# Patient Record
Sex: Female | Born: 1950 | Race: Black or African American | Hispanic: No | Smoking: Former smoker
Health system: Southern US, Community
[De-identification: ages and names within clinical notes are randomized; demographics above are authoritative.]

## PROBLEM LIST (undated history)

## (undated) DIAGNOSIS — G629 Polyneuropathy, unspecified: Secondary | ICD-10-CM

## (undated) DIAGNOSIS — J45909 Unspecified asthma, uncomplicated: Secondary | ICD-10-CM

## (undated) DIAGNOSIS — I10 Essential (primary) hypertension: Secondary | ICD-10-CM

## (undated) DIAGNOSIS — J449 Chronic obstructive pulmonary disease, unspecified: Secondary | ICD-10-CM

## (undated) DIAGNOSIS — E119 Type 2 diabetes mellitus without complications: Secondary | ICD-10-CM

## (undated) DIAGNOSIS — K219 Gastro-esophageal reflux disease without esophagitis: Secondary | ICD-10-CM

## (undated) DIAGNOSIS — H269 Unspecified cataract: Secondary | ICD-10-CM

## (undated) HISTORY — PX: CHOLECYSTECTOMY: SHX55

## (undated) HISTORY — PX: LUNG SURGERY: SHX703

## (undated) HISTORY — PX: BACK SURGERY: SHX140

---

## 2009-09-22 ENCOUNTER — Encounter: Admission: RE | Admit: 2009-09-22 | Discharge: 2009-12-21 | Payer: Self-pay | Admitting: Neurology

## 2015-07-07 DIAGNOSIS — S2001XA Contusion of right breast, initial encounter: Secondary | ICD-10-CM | POA: Diagnosis not present

## 2015-07-07 DIAGNOSIS — M5417 Radiculopathy, lumbosacral region: Secondary | ICD-10-CM | POA: Diagnosis not present

## 2015-07-07 DIAGNOSIS — R55 Syncope and collapse: Secondary | ICD-10-CM | POA: Diagnosis not present

## 2015-07-07 DIAGNOSIS — J449 Chronic obstructive pulmonary disease, unspecified: Secondary | ICD-10-CM | POA: Diagnosis not present

## 2015-07-07 DIAGNOSIS — E1140 Type 2 diabetes mellitus with diabetic neuropathy, unspecified: Secondary | ICD-10-CM | POA: Diagnosis not present

## 2015-07-07 DIAGNOSIS — S301XXA Contusion of abdominal wall, initial encounter: Secondary | ICD-10-CM | POA: Diagnosis not present

## 2015-07-07 DIAGNOSIS — E1165 Type 2 diabetes mellitus with hyperglycemia: Secondary | ICD-10-CM | POA: Diagnosis not present

## 2015-07-07 DIAGNOSIS — M8580 Other specified disorders of bone density and structure, unspecified site: Secondary | ICD-10-CM | POA: Diagnosis not present

## 2015-07-08 DIAGNOSIS — I517 Cardiomegaly: Secondary | ICD-10-CM | POA: Diagnosis not present

## 2015-07-08 DIAGNOSIS — R55 Syncope and collapse: Secondary | ICD-10-CM | POA: Diagnosis not present

## 2015-07-08 DIAGNOSIS — S20219A Contusion of unspecified front wall of thorax, initial encounter: Secondary | ICD-10-CM | POA: Diagnosis not present

## 2015-07-08 DIAGNOSIS — S2001XA Contusion of right breast, initial encounter: Secondary | ICD-10-CM | POA: Diagnosis not present

## 2015-07-08 DIAGNOSIS — W2211XA Striking against or struck by driver side automobile airbag, initial encounter: Secondary | ICD-10-CM | POA: Diagnosis not present

## 2015-07-09 DIAGNOSIS — Z79891 Long term (current) use of opiate analgesic: Secondary | ICD-10-CM | POA: Diagnosis not present

## 2015-07-09 DIAGNOSIS — Z888 Allergy status to other drugs, medicaments and biological substances status: Secondary | ICD-10-CM | POA: Diagnosis not present

## 2015-07-09 DIAGNOSIS — M5417 Radiculopathy, lumbosacral region: Secondary | ICD-10-CM | POA: Diagnosis present

## 2015-07-09 DIAGNOSIS — M810 Age-related osteoporosis without current pathological fracture: Secondary | ICD-10-CM | POA: Diagnosis present

## 2015-07-09 DIAGNOSIS — Z2821 Immunization not carried out because of patient refusal: Secondary | ICD-10-CM | POA: Diagnosis not present

## 2015-07-09 DIAGNOSIS — J45909 Unspecified asthma, uncomplicated: Secondary | ICD-10-CM | POA: Diagnosis present

## 2015-07-09 DIAGNOSIS — J449 Chronic obstructive pulmonary disease, unspecified: Secondary | ICD-10-CM | POA: Diagnosis present

## 2015-07-09 DIAGNOSIS — E1140 Type 2 diabetes mellitus with diabetic neuropathy, unspecified: Secondary | ICD-10-CM | POA: Diagnosis present

## 2015-07-09 DIAGNOSIS — S2001XA Contusion of right breast, initial encounter: Secondary | ICD-10-CM | POA: Diagnosis present

## 2015-07-09 DIAGNOSIS — K219 Gastro-esophageal reflux disease without esophagitis: Secondary | ICD-10-CM | POA: Diagnosis present

## 2015-07-09 DIAGNOSIS — Z794 Long term (current) use of insulin: Secondary | ICD-10-CM | POA: Diagnosis not present

## 2015-07-09 DIAGNOSIS — E119 Type 2 diabetes mellitus without complications: Secondary | ICD-10-CM | POA: Diagnosis not present

## 2015-07-09 DIAGNOSIS — Z881 Allergy status to other antibiotic agents status: Secondary | ICD-10-CM | POA: Diagnosis not present

## 2015-07-09 DIAGNOSIS — Z79899 Other long term (current) drug therapy: Secondary | ICD-10-CM | POA: Diagnosis not present

## 2015-07-09 DIAGNOSIS — M8580 Other specified disorders of bone density and structure, unspecified site: Secondary | ICD-10-CM | POA: Diagnosis present

## 2015-07-09 DIAGNOSIS — Z7952 Long term (current) use of systemic steroids: Secondary | ICD-10-CM | POA: Diagnosis not present

## 2015-07-09 DIAGNOSIS — Z7951 Long term (current) use of inhaled steroids: Secondary | ICD-10-CM | POA: Diagnosis not present

## 2015-07-09 DIAGNOSIS — Z7984 Long term (current) use of oral hypoglycemic drugs: Secondary | ICD-10-CM | POA: Diagnosis not present

## 2015-07-09 DIAGNOSIS — I10 Essential (primary) hypertension: Secondary | ICD-10-CM | POA: Diagnosis present

## 2015-07-09 DIAGNOSIS — R55 Syncope and collapse: Secondary | ICD-10-CM | POA: Diagnosis present

## 2015-07-09 DIAGNOSIS — Z90710 Acquired absence of both cervix and uterus: Secondary | ICD-10-CM | POA: Diagnosis not present

## 2015-07-14 DIAGNOSIS — Z09 Encounter for follow-up examination after completed treatment for conditions other than malignant neoplasm: Secondary | ICD-10-CM | POA: Diagnosis not present

## 2015-07-14 DIAGNOSIS — R55 Syncope and collapse: Secondary | ICD-10-CM | POA: Diagnosis not present

## 2015-07-14 DIAGNOSIS — E1140 Type 2 diabetes mellitus with diabetic neuropathy, unspecified: Secondary | ICD-10-CM | POA: Diagnosis not present

## 2015-07-14 DIAGNOSIS — J449 Chronic obstructive pulmonary disease, unspecified: Secondary | ICD-10-CM | POA: Diagnosis not present

## 2015-07-21 DIAGNOSIS — J984 Other disorders of lung: Secondary | ICD-10-CM | POA: Diagnosis not present

## 2015-07-21 DIAGNOSIS — R55 Syncope and collapse: Secondary | ICD-10-CM | POA: Diagnosis not present

## 2015-07-21 DIAGNOSIS — Z794 Long term (current) use of insulin: Secondary | ICD-10-CM | POA: Diagnosis not present

## 2015-07-21 DIAGNOSIS — R9431 Abnormal electrocardiogram [ECG] [EKG]: Secondary | ICD-10-CM | POA: Diagnosis not present

## 2015-07-21 DIAGNOSIS — E119 Type 2 diabetes mellitus without complications: Secondary | ICD-10-CM | POA: Diagnosis not present

## 2015-07-21 DIAGNOSIS — I10 Essential (primary) hypertension: Secondary | ICD-10-CM | POA: Diagnosis not present

## 2015-07-29 DIAGNOSIS — R55 Syncope and collapse: Secondary | ICD-10-CM | POA: Diagnosis not present

## 2015-08-18 DIAGNOSIS — M5126 Other intervertebral disc displacement, lumbar region: Secondary | ICD-10-CM | POA: Diagnosis not present

## 2015-08-31 DIAGNOSIS — E119 Type 2 diabetes mellitus without complications: Secondary | ICD-10-CM | POA: Diagnosis not present

## 2015-08-31 DIAGNOSIS — I10 Essential (primary) hypertension: Secondary | ICD-10-CM | POA: Diagnosis not present

## 2015-08-31 DIAGNOSIS — J449 Chronic obstructive pulmonary disease, unspecified: Secondary | ICD-10-CM | POA: Diagnosis not present

## 2015-09-06 DIAGNOSIS — E1122 Type 2 diabetes mellitus with diabetic chronic kidney disease: Secondary | ICD-10-CM | POA: Diagnosis not present

## 2015-09-06 DIAGNOSIS — Z789 Other specified health status: Secondary | ICD-10-CM | POA: Diagnosis not present

## 2015-09-06 DIAGNOSIS — M545 Low back pain: Secondary | ICD-10-CM | POA: Diagnosis not present

## 2015-09-06 DIAGNOSIS — J449 Chronic obstructive pulmonary disease, unspecified: Secondary | ICD-10-CM | POA: Diagnosis not present

## 2015-09-06 DIAGNOSIS — I10 Essential (primary) hypertension: Secondary | ICD-10-CM | POA: Diagnosis not present

## 2015-10-06 DIAGNOSIS — J449 Chronic obstructive pulmonary disease, unspecified: Secondary | ICD-10-CM | POA: Diagnosis not present

## 2015-10-06 DIAGNOSIS — E119 Type 2 diabetes mellitus without complications: Secondary | ICD-10-CM | POA: Diagnosis not present

## 2015-10-06 DIAGNOSIS — I10 Essential (primary) hypertension: Secondary | ICD-10-CM | POA: Diagnosis not present

## 2015-11-09 DIAGNOSIS — E119 Type 2 diabetes mellitus without complications: Secondary | ICD-10-CM | POA: Diagnosis not present

## 2015-11-09 DIAGNOSIS — I10 Essential (primary) hypertension: Secondary | ICD-10-CM | POA: Diagnosis not present

## 2015-11-09 DIAGNOSIS — J449 Chronic obstructive pulmonary disease, unspecified: Secondary | ICD-10-CM | POA: Diagnosis not present

## 2015-12-26 DIAGNOSIS — E1140 Type 2 diabetes mellitus with diabetic neuropathy, unspecified: Secondary | ICD-10-CM | POA: Diagnosis not present

## 2015-12-26 DIAGNOSIS — Z299 Encounter for prophylactic measures, unspecified: Secondary | ICD-10-CM | POA: Diagnosis not present

## 2015-12-30 DIAGNOSIS — E119 Type 2 diabetes mellitus without complications: Secondary | ICD-10-CM | POA: Diagnosis not present

## 2015-12-30 DIAGNOSIS — J449 Chronic obstructive pulmonary disease, unspecified: Secondary | ICD-10-CM | POA: Diagnosis not present

## 2015-12-30 DIAGNOSIS — I10 Essential (primary) hypertension: Secondary | ICD-10-CM | POA: Diagnosis not present

## 2016-01-09 DIAGNOSIS — Z1211 Encounter for screening for malignant neoplasm of colon: Secondary | ICD-10-CM | POA: Diagnosis not present

## 2016-01-09 DIAGNOSIS — Z7189 Other specified counseling: Secondary | ICD-10-CM | POA: Diagnosis not present

## 2016-01-09 DIAGNOSIS — E1122 Type 2 diabetes mellitus with diabetic chronic kidney disease: Secondary | ICD-10-CM | POA: Diagnosis not present

## 2016-01-09 DIAGNOSIS — Z299 Encounter for prophylactic measures, unspecified: Secondary | ICD-10-CM | POA: Diagnosis not present

## 2016-01-09 DIAGNOSIS — F329 Major depressive disorder, single episode, unspecified: Secondary | ICD-10-CM | POA: Diagnosis not present

## 2016-01-09 DIAGNOSIS — Z0000 Encounter for general adult medical examination without abnormal findings: Secondary | ICD-10-CM | POA: Diagnosis not present

## 2016-01-17 DIAGNOSIS — R5383 Other fatigue: Secondary | ICD-10-CM | POA: Diagnosis not present

## 2016-01-17 DIAGNOSIS — Z79899 Other long term (current) drug therapy: Secondary | ICD-10-CM | POA: Diagnosis not present

## 2016-02-10 DIAGNOSIS — J449 Chronic obstructive pulmonary disease, unspecified: Secondary | ICD-10-CM | POA: Diagnosis not present

## 2016-02-10 DIAGNOSIS — I10 Essential (primary) hypertension: Secondary | ICD-10-CM | POA: Diagnosis not present

## 2016-02-10 DIAGNOSIS — E119 Type 2 diabetes mellitus without complications: Secondary | ICD-10-CM | POA: Diagnosis not present

## 2016-02-22 DIAGNOSIS — M5126 Other intervertebral disc displacement, lumbar region: Secondary | ICD-10-CM | POA: Diagnosis not present

## 2016-04-11 DIAGNOSIS — J449 Chronic obstructive pulmonary disease, unspecified: Secondary | ICD-10-CM | POA: Diagnosis not present

## 2016-04-11 DIAGNOSIS — Z299 Encounter for prophylactic measures, unspecified: Secondary | ICD-10-CM | POA: Diagnosis not present

## 2016-04-11 DIAGNOSIS — E1140 Type 2 diabetes mellitus with diabetic neuropathy, unspecified: Secondary | ICD-10-CM | POA: Diagnosis not present

## 2016-04-11 DIAGNOSIS — I10 Essential (primary) hypertension: Secondary | ICD-10-CM | POA: Diagnosis not present

## 2016-04-18 DIAGNOSIS — E119 Type 2 diabetes mellitus without complications: Secondary | ICD-10-CM | POA: Diagnosis not present

## 2016-04-18 DIAGNOSIS — J449 Chronic obstructive pulmonary disease, unspecified: Secondary | ICD-10-CM | POA: Diagnosis not present

## 2016-04-18 DIAGNOSIS — I10 Essential (primary) hypertension: Secondary | ICD-10-CM | POA: Diagnosis not present

## 2016-05-24 DIAGNOSIS — J449 Chronic obstructive pulmonary disease, unspecified: Secondary | ICD-10-CM | POA: Diagnosis not present

## 2016-05-24 DIAGNOSIS — E119 Type 2 diabetes mellitus without complications: Secondary | ICD-10-CM | POA: Diagnosis not present

## 2016-05-24 DIAGNOSIS — I10 Essential (primary) hypertension: Secondary | ICD-10-CM | POA: Diagnosis not present

## 2016-06-21 DIAGNOSIS — J449 Chronic obstructive pulmonary disease, unspecified: Secondary | ICD-10-CM | POA: Diagnosis not present

## 2016-06-21 DIAGNOSIS — I10 Essential (primary) hypertension: Secondary | ICD-10-CM | POA: Diagnosis not present

## 2016-06-21 DIAGNOSIS — E119 Type 2 diabetes mellitus without complications: Secondary | ICD-10-CM | POA: Diagnosis not present

## 2016-07-04 DIAGNOSIS — E1165 Type 2 diabetes mellitus with hyperglycemia: Secondary | ICD-10-CM | POA: Diagnosis not present

## 2016-07-04 DIAGNOSIS — I2699 Other pulmonary embolism without acute cor pulmonale: Secondary | ICD-10-CM | POA: Diagnosis not present

## 2016-07-04 DIAGNOSIS — R0602 Shortness of breath: Secondary | ICD-10-CM | POA: Diagnosis not present

## 2016-07-04 DIAGNOSIS — K869 Disease of pancreas, unspecified: Secondary | ICD-10-CM | POA: Diagnosis not present

## 2016-07-04 DIAGNOSIS — R079 Chest pain, unspecified: Secondary | ICD-10-CM | POA: Diagnosis not present

## 2016-07-05 DIAGNOSIS — G8929 Other chronic pain: Secondary | ICD-10-CM | POA: Diagnosis present

## 2016-07-05 DIAGNOSIS — Z7951 Long term (current) use of inhaled steroids: Secondary | ICD-10-CM | POA: Diagnosis not present

## 2016-07-05 DIAGNOSIS — I10 Essential (primary) hypertension: Secondary | ICD-10-CM | POA: Diagnosis present

## 2016-07-05 DIAGNOSIS — M549 Dorsalgia, unspecified: Secondary | ICD-10-CM | POA: Diagnosis present

## 2016-07-05 DIAGNOSIS — J449 Chronic obstructive pulmonary disease, unspecified: Secondary | ICD-10-CM | POA: Diagnosis present

## 2016-07-05 DIAGNOSIS — Z79891 Long term (current) use of opiate analgesic: Secondary | ICD-10-CM | POA: Diagnosis not present

## 2016-07-05 DIAGNOSIS — K869 Disease of pancreas, unspecified: Secondary | ICD-10-CM | POA: Diagnosis present

## 2016-07-05 DIAGNOSIS — Z2821 Immunization not carried out because of patient refusal: Secondary | ICD-10-CM | POA: Diagnosis not present

## 2016-07-05 DIAGNOSIS — Z79899 Other long term (current) drug therapy: Secondary | ICD-10-CM | POA: Diagnosis not present

## 2016-07-05 DIAGNOSIS — R0602 Shortness of breath: Secondary | ICD-10-CM | POA: Diagnosis not present

## 2016-07-05 DIAGNOSIS — I2699 Other pulmonary embolism without acute cor pulmonale: Secondary | ICD-10-CM | POA: Diagnosis present

## 2016-07-05 DIAGNOSIS — K219 Gastro-esophageal reflux disease without esophagitis: Secondary | ICD-10-CM | POA: Diagnosis present

## 2016-07-05 DIAGNOSIS — Z794 Long term (current) use of insulin: Secondary | ICD-10-CM | POA: Diagnosis not present

## 2016-07-05 DIAGNOSIS — E1165 Type 2 diabetes mellitus with hyperglycemia: Secondary | ICD-10-CM | POA: Diagnosis present

## 2016-07-05 DIAGNOSIS — M810 Age-related osteoporosis without current pathological fracture: Secondary | ICD-10-CM | POA: Diagnosis present

## 2016-07-13 DIAGNOSIS — K869 Disease of pancreas, unspecified: Secondary | ICD-10-CM | POA: Diagnosis not present

## 2016-07-13 DIAGNOSIS — J441 Chronic obstructive pulmonary disease with (acute) exacerbation: Secondary | ICD-10-CM | POA: Diagnosis not present

## 2016-07-13 DIAGNOSIS — Z09 Encounter for follow-up examination after completed treatment for conditions other than malignant neoplasm: Secondary | ICD-10-CM | POA: Diagnosis not present

## 2016-07-13 DIAGNOSIS — I2699 Other pulmonary embolism without acute cor pulmonale: Secondary | ICD-10-CM | POA: Diagnosis not present

## 2016-07-18 DIAGNOSIS — I10 Essential (primary) hypertension: Secondary | ICD-10-CM | POA: Diagnosis not present

## 2016-07-23 DIAGNOSIS — K760 Fatty (change of) liver, not elsewhere classified: Secondary | ICD-10-CM | POA: Diagnosis not present

## 2016-07-23 DIAGNOSIS — D1809 Hemangioma of other sites: Secondary | ICD-10-CM | POA: Diagnosis not present

## 2016-07-23 DIAGNOSIS — K869 Disease of pancreas, unspecified: Secondary | ICD-10-CM | POA: Diagnosis not present

## 2016-08-01 DIAGNOSIS — H353131 Nonexudative age-related macular degeneration, bilateral, early dry stage: Secondary | ICD-10-CM | POA: Diagnosis not present

## 2016-08-01 DIAGNOSIS — M791 Myalgia: Secondary | ICD-10-CM | POA: Diagnosis not present

## 2016-08-01 DIAGNOSIS — Z713 Dietary counseling and surveillance: Secondary | ICD-10-CM | POA: Diagnosis not present

## 2016-08-01 DIAGNOSIS — E119 Type 2 diabetes mellitus without complications: Secondary | ICD-10-CM | POA: Diagnosis not present

## 2016-08-01 DIAGNOSIS — E1122 Type 2 diabetes mellitus with diabetic chronic kidney disease: Secondary | ICD-10-CM | POA: Diagnosis not present

## 2016-08-01 DIAGNOSIS — Z6833 Body mass index (BMI) 33.0-33.9, adult: Secondary | ICD-10-CM | POA: Diagnosis not present

## 2016-08-01 DIAGNOSIS — J449 Chronic obstructive pulmonary disease, unspecified: Secondary | ICD-10-CM | POA: Diagnosis not present

## 2016-08-01 DIAGNOSIS — Z299 Encounter for prophylactic measures, unspecified: Secondary | ICD-10-CM | POA: Diagnosis not present

## 2016-08-01 DIAGNOSIS — Z7984 Long term (current) use of oral hypoglycemic drugs: Secondary | ICD-10-CM | POA: Diagnosis not present

## 2016-08-01 DIAGNOSIS — E1140 Type 2 diabetes mellitus with diabetic neuropathy, unspecified: Secondary | ICD-10-CM | POA: Diagnosis not present

## 2016-08-01 DIAGNOSIS — H3540 Unspecified peripheral retinal degeneration: Secondary | ICD-10-CM | POA: Diagnosis not present

## 2016-08-01 DIAGNOSIS — I10 Essential (primary) hypertension: Secondary | ICD-10-CM | POA: Diagnosis not present

## 2016-09-05 DIAGNOSIS — M5126 Other intervertebral disc displacement, lumbar region: Secondary | ICD-10-CM | POA: Diagnosis not present

## 2016-09-05 DIAGNOSIS — M810 Age-related osteoporosis without current pathological fracture: Secondary | ICD-10-CM | POA: Diagnosis not present

## 2016-10-18 DIAGNOSIS — J449 Chronic obstructive pulmonary disease, unspecified: Secondary | ICD-10-CM | POA: Diagnosis not present

## 2016-10-18 DIAGNOSIS — E119 Type 2 diabetes mellitus without complications: Secondary | ICD-10-CM | POA: Diagnosis not present

## 2016-10-18 DIAGNOSIS — I10 Essential (primary) hypertension: Secondary | ICD-10-CM | POA: Diagnosis not present

## 2016-11-15 DIAGNOSIS — J449 Chronic obstructive pulmonary disease, unspecified: Secondary | ICD-10-CM | POA: Diagnosis not present

## 2016-11-15 DIAGNOSIS — I10 Essential (primary) hypertension: Secondary | ICD-10-CM | POA: Diagnosis not present

## 2016-11-15 DIAGNOSIS — E119 Type 2 diabetes mellitus without complications: Secondary | ICD-10-CM | POA: Diagnosis not present

## 2016-11-29 DIAGNOSIS — E1165 Type 2 diabetes mellitus with hyperglycemia: Secondary | ICD-10-CM | POA: Diagnosis not present

## 2016-11-29 DIAGNOSIS — Z6831 Body mass index (BMI) 31.0-31.9, adult: Secondary | ICD-10-CM | POA: Diagnosis not present

## 2016-11-29 DIAGNOSIS — I10 Essential (primary) hypertension: Secondary | ICD-10-CM | POA: Diagnosis not present

## 2016-11-29 DIAGNOSIS — M1990 Unspecified osteoarthritis, unspecified site: Secondary | ICD-10-CM | POA: Diagnosis not present

## 2016-11-29 DIAGNOSIS — J449 Chronic obstructive pulmonary disease, unspecified: Secondary | ICD-10-CM | POA: Diagnosis not present

## 2016-11-29 DIAGNOSIS — N183 Chronic kidney disease, stage 3 (moderate): Secondary | ICD-10-CM | POA: Diagnosis not present

## 2016-11-29 DIAGNOSIS — F329 Major depressive disorder, single episode, unspecified: Secondary | ICD-10-CM | POA: Diagnosis not present

## 2016-11-29 DIAGNOSIS — K219 Gastro-esophageal reflux disease without esophagitis: Secondary | ICD-10-CM | POA: Diagnosis not present

## 2016-11-29 DIAGNOSIS — E1142 Type 2 diabetes mellitus with diabetic polyneuropathy: Secondary | ICD-10-CM | POA: Diagnosis not present

## 2016-11-29 DIAGNOSIS — J309 Allergic rhinitis, unspecified: Secondary | ICD-10-CM | POA: Diagnosis not present

## 2016-11-29 DIAGNOSIS — Z299 Encounter for prophylactic measures, unspecified: Secondary | ICD-10-CM | POA: Diagnosis not present

## 2016-11-29 DIAGNOSIS — E1122 Type 2 diabetes mellitus with diabetic chronic kidney disease: Secondary | ICD-10-CM | POA: Diagnosis not present

## 2016-12-12 DIAGNOSIS — M810 Age-related osteoporosis without current pathological fracture: Secondary | ICD-10-CM | POA: Diagnosis not present

## 2016-12-12 DIAGNOSIS — M5126 Other intervertebral disc displacement, lumbar region: Secondary | ICD-10-CM | POA: Diagnosis not present

## 2017-01-09 DIAGNOSIS — Z6833 Body mass index (BMI) 33.0-33.9, adult: Secondary | ICD-10-CM | POA: Diagnosis not present

## 2017-01-09 DIAGNOSIS — F329 Major depressive disorder, single episode, unspecified: Secondary | ICD-10-CM | POA: Diagnosis not present

## 2017-01-09 DIAGNOSIS — Z1389 Encounter for screening for other disorder: Secondary | ICD-10-CM | POA: Diagnosis not present

## 2017-01-09 DIAGNOSIS — R5383 Other fatigue: Secondary | ICD-10-CM | POA: Diagnosis not present

## 2017-01-09 DIAGNOSIS — Z0000 Encounter for general adult medical examination without abnormal findings: Secondary | ICD-10-CM | POA: Diagnosis not present

## 2017-01-09 DIAGNOSIS — N183 Chronic kidney disease, stage 3 (moderate): Secondary | ICD-10-CM | POA: Diagnosis not present

## 2017-01-09 DIAGNOSIS — E1122 Type 2 diabetes mellitus with diabetic chronic kidney disease: Secondary | ICD-10-CM | POA: Diagnosis not present

## 2017-01-09 DIAGNOSIS — E1165 Type 2 diabetes mellitus with hyperglycemia: Secondary | ICD-10-CM | POA: Diagnosis not present

## 2017-01-09 DIAGNOSIS — Z299 Encounter for prophylactic measures, unspecified: Secondary | ICD-10-CM | POA: Diagnosis not present

## 2017-01-09 DIAGNOSIS — E1142 Type 2 diabetes mellitus with diabetic polyneuropathy: Secondary | ICD-10-CM | POA: Diagnosis not present

## 2017-01-09 DIAGNOSIS — Z79899 Other long term (current) drug therapy: Secondary | ICD-10-CM | POA: Diagnosis not present

## 2017-01-09 DIAGNOSIS — I10 Essential (primary) hypertension: Secondary | ICD-10-CM | POA: Diagnosis not present

## 2017-01-09 DIAGNOSIS — Z7189 Other specified counseling: Secondary | ICD-10-CM | POA: Diagnosis not present

## 2017-01-09 DIAGNOSIS — Z1211 Encounter for screening for malignant neoplasm of colon: Secondary | ICD-10-CM | POA: Diagnosis not present

## 2017-01-15 DIAGNOSIS — E119 Type 2 diabetes mellitus without complications: Secondary | ICD-10-CM | POA: Diagnosis not present

## 2017-01-15 DIAGNOSIS — I10 Essential (primary) hypertension: Secondary | ICD-10-CM | POA: Diagnosis not present

## 2017-01-15 DIAGNOSIS — J449 Chronic obstructive pulmonary disease, unspecified: Secondary | ICD-10-CM | POA: Diagnosis not present

## 2017-02-22 DIAGNOSIS — E119 Type 2 diabetes mellitus without complications: Secondary | ICD-10-CM | POA: Diagnosis not present

## 2017-02-22 DIAGNOSIS — J449 Chronic obstructive pulmonary disease, unspecified: Secondary | ICD-10-CM | POA: Diagnosis not present

## 2017-02-22 DIAGNOSIS — I10 Essential (primary) hypertension: Secondary | ICD-10-CM | POA: Diagnosis not present

## 2017-03-07 DIAGNOSIS — E1142 Type 2 diabetes mellitus with diabetic polyneuropathy: Secondary | ICD-10-CM | POA: Diagnosis not present

## 2017-03-07 DIAGNOSIS — Z299 Encounter for prophylactic measures, unspecified: Secondary | ICD-10-CM | POA: Diagnosis not present

## 2017-03-07 DIAGNOSIS — J449 Chronic obstructive pulmonary disease, unspecified: Secondary | ICD-10-CM | POA: Diagnosis not present

## 2017-03-07 DIAGNOSIS — N183 Chronic kidney disease, stage 3 (moderate): Secondary | ICD-10-CM | POA: Diagnosis not present

## 2017-03-07 DIAGNOSIS — E1122 Type 2 diabetes mellitus with diabetic chronic kidney disease: Secondary | ICD-10-CM | POA: Diagnosis not present

## 2017-03-07 DIAGNOSIS — F329 Major depressive disorder, single episode, unspecified: Secondary | ICD-10-CM | POA: Diagnosis not present

## 2017-03-07 DIAGNOSIS — I10 Essential (primary) hypertension: Secondary | ICD-10-CM | POA: Diagnosis not present

## 2017-03-07 DIAGNOSIS — E1165 Type 2 diabetes mellitus with hyperglycemia: Secondary | ICD-10-CM | POA: Diagnosis not present

## 2017-03-07 DIAGNOSIS — E8940 Asymptomatic postprocedural ovarian failure: Secondary | ICD-10-CM | POA: Diagnosis not present

## 2017-03-07 DIAGNOSIS — Z6833 Body mass index (BMI) 33.0-33.9, adult: Secondary | ICD-10-CM | POA: Diagnosis not present

## 2017-03-22 DIAGNOSIS — M48062 Spinal stenosis, lumbar region with neurogenic claudication: Secondary | ICD-10-CM | POA: Diagnosis not present

## 2017-03-22 DIAGNOSIS — M4316 Spondylolisthesis, lumbar region: Secondary | ICD-10-CM | POA: Diagnosis not present

## 2017-03-25 DIAGNOSIS — J441 Chronic obstructive pulmonary disease with (acute) exacerbation: Secondary | ICD-10-CM | POA: Diagnosis not present

## 2017-03-25 DIAGNOSIS — E1165 Type 2 diabetes mellitus with hyperglycemia: Secondary | ICD-10-CM | POA: Diagnosis not present

## 2017-03-25 DIAGNOSIS — I10 Essential (primary) hypertension: Secondary | ICD-10-CM | POA: Diagnosis not present

## 2017-03-25 DIAGNOSIS — Z6821 Body mass index (BMI) 21.0-21.9, adult: Secondary | ICD-10-CM | POA: Diagnosis not present

## 2017-03-25 DIAGNOSIS — Z87891 Personal history of nicotine dependence: Secondary | ICD-10-CM | POA: Diagnosis not present

## 2017-03-25 DIAGNOSIS — E669 Obesity, unspecified: Secondary | ICD-10-CM | POA: Diagnosis not present

## 2017-03-25 DIAGNOSIS — Z299 Encounter for prophylactic measures, unspecified: Secondary | ICD-10-CM | POA: Diagnosis not present

## 2017-03-25 DIAGNOSIS — J449 Chronic obstructive pulmonary disease, unspecified: Secondary | ICD-10-CM | POA: Diagnosis not present

## 2017-03-29 DIAGNOSIS — M4316 Spondylolisthesis, lumbar region: Secondary | ICD-10-CM | POA: Diagnosis not present

## 2017-03-29 DIAGNOSIS — M48062 Spinal stenosis, lumbar region with neurogenic claudication: Secondary | ICD-10-CM | POA: Diagnosis not present

## 2017-03-29 DIAGNOSIS — M48061 Spinal stenosis, lumbar region without neurogenic claudication: Secondary | ICD-10-CM | POA: Diagnosis not present

## 2017-03-29 DIAGNOSIS — M47816 Spondylosis without myelopathy or radiculopathy, lumbar region: Secondary | ICD-10-CM | POA: Diagnosis not present

## 2017-03-29 DIAGNOSIS — M47817 Spondylosis without myelopathy or radiculopathy, lumbosacral region: Secondary | ICD-10-CM | POA: Diagnosis not present

## 2017-03-29 DIAGNOSIS — M5136 Other intervertebral disc degeneration, lumbar region: Secondary | ICD-10-CM | POA: Diagnosis not present

## 2017-03-29 DIAGNOSIS — M4807 Spinal stenosis, lumbosacral region: Secondary | ICD-10-CM | POA: Diagnosis not present

## 2017-04-05 DIAGNOSIS — E119 Type 2 diabetes mellitus without complications: Secondary | ICD-10-CM | POA: Diagnosis not present

## 2017-04-05 DIAGNOSIS — I10 Essential (primary) hypertension: Secondary | ICD-10-CM | POA: Diagnosis not present

## 2017-04-05 DIAGNOSIS — J449 Chronic obstructive pulmonary disease, unspecified: Secondary | ICD-10-CM | POA: Diagnosis not present

## 2017-04-12 DIAGNOSIS — M4716 Other spondylosis with myelopathy, lumbar region: Secondary | ICD-10-CM | POA: Diagnosis not present

## 2017-04-12 DIAGNOSIS — M4316 Spondylolisthesis, lumbar region: Secondary | ICD-10-CM | POA: Diagnosis not present

## 2017-04-12 DIAGNOSIS — M48062 Spinal stenosis, lumbar region with neurogenic claudication: Secondary | ICD-10-CM | POA: Diagnosis not present

## 2017-04-26 DIAGNOSIS — J441 Chronic obstructive pulmonary disease with (acute) exacerbation: Secondary | ICD-10-CM | POA: Diagnosis not present

## 2017-04-26 DIAGNOSIS — Z6834 Body mass index (BMI) 34.0-34.9, adult: Secondary | ICD-10-CM | POA: Diagnosis not present

## 2017-04-26 DIAGNOSIS — M79605 Pain in left leg: Secondary | ICD-10-CM | POA: Diagnosis not present

## 2017-04-26 DIAGNOSIS — M1710 Unilateral primary osteoarthritis, unspecified knee: Secondary | ICD-10-CM | POA: Diagnosis not present

## 2017-04-26 DIAGNOSIS — Z299 Encounter for prophylactic measures, unspecified: Secondary | ICD-10-CM | POA: Diagnosis not present

## 2017-04-26 DIAGNOSIS — M79604 Pain in right leg: Secondary | ICD-10-CM | POA: Diagnosis not present

## 2017-04-26 DIAGNOSIS — B370 Candidal stomatitis: Secondary | ICD-10-CM | POA: Diagnosis not present

## 2017-05-03 DIAGNOSIS — I10 Essential (primary) hypertension: Secondary | ICD-10-CM | POA: Diagnosis not present

## 2017-05-03 DIAGNOSIS — E119 Type 2 diabetes mellitus without complications: Secondary | ICD-10-CM | POA: Diagnosis not present

## 2017-05-03 DIAGNOSIS — J449 Chronic obstructive pulmonary disease, unspecified: Secondary | ICD-10-CM | POA: Diagnosis not present

## 2017-05-28 DIAGNOSIS — Z79899 Other long term (current) drug therapy: Secondary | ICD-10-CM | POA: Diagnosis not present

## 2017-05-28 DIAGNOSIS — Z538 Procedure and treatment not carried out for other reasons: Secondary | ICD-10-CM | POA: Diagnosis not present

## 2017-05-28 DIAGNOSIS — J449 Chronic obstructive pulmonary disease, unspecified: Secondary | ICD-10-CM | POA: Diagnosis not present

## 2017-05-28 DIAGNOSIS — R0989 Other specified symptoms and signs involving the circulatory and respiratory systems: Secondary | ICD-10-CM | POA: Diagnosis not present

## 2017-05-28 DIAGNOSIS — E119 Type 2 diabetes mellitus without complications: Secondary | ICD-10-CM | POA: Diagnosis not present

## 2017-05-28 DIAGNOSIS — M4316 Spondylolisthesis, lumbar region: Secondary | ICD-10-CM | POA: Diagnosis not present

## 2017-05-28 DIAGNOSIS — Z7951 Long term (current) use of inhaled steroids: Secondary | ICD-10-CM | POA: Diagnosis not present

## 2017-05-28 DIAGNOSIS — I10 Essential (primary) hypertension: Secondary | ICD-10-CM | POA: Diagnosis not present

## 2017-05-28 DIAGNOSIS — Z794 Long term (current) use of insulin: Secondary | ICD-10-CM | POA: Diagnosis not present

## 2017-05-30 DIAGNOSIS — M4316 Spondylolisthesis, lumbar region: Secondary | ICD-10-CM | POA: Diagnosis not present

## 2017-05-30 DIAGNOSIS — Z79899 Other long term (current) drug therapy: Secondary | ICD-10-CM | POA: Diagnosis not present

## 2017-05-30 DIAGNOSIS — Z538 Procedure and treatment not carried out for other reasons: Secondary | ICD-10-CM | POA: Diagnosis not present

## 2017-05-30 DIAGNOSIS — E119 Type 2 diabetes mellitus without complications: Secondary | ICD-10-CM | POA: Diagnosis not present

## 2017-05-30 DIAGNOSIS — J449 Chronic obstructive pulmonary disease, unspecified: Secondary | ICD-10-CM | POA: Diagnosis not present

## 2017-05-30 DIAGNOSIS — I10 Essential (primary) hypertension: Secondary | ICD-10-CM | POA: Diagnosis not present

## 2017-06-05 DIAGNOSIS — E119 Type 2 diabetes mellitus without complications: Secondary | ICD-10-CM | POA: Diagnosis not present

## 2017-06-05 DIAGNOSIS — I10 Essential (primary) hypertension: Secondary | ICD-10-CM | POA: Diagnosis not present

## 2017-06-05 DIAGNOSIS — J449 Chronic obstructive pulmonary disease, unspecified: Secondary | ICD-10-CM | POA: Diagnosis not present

## 2017-06-07 DIAGNOSIS — D72828 Other elevated white blood cell count: Secondary | ICD-10-CM | POA: Diagnosis not present

## 2017-06-10 DIAGNOSIS — M545 Low back pain: Secondary | ICD-10-CM | POA: Diagnosis not present

## 2017-06-10 DIAGNOSIS — D72829 Elevated white blood cell count, unspecified: Secondary | ICD-10-CM | POA: Diagnosis not present

## 2017-06-10 DIAGNOSIS — E1165 Type 2 diabetes mellitus with hyperglycemia: Secondary | ICD-10-CM | POA: Diagnosis not present

## 2017-06-10 DIAGNOSIS — E1142 Type 2 diabetes mellitus with diabetic polyneuropathy: Secondary | ICD-10-CM | POA: Diagnosis not present

## 2017-06-10 DIAGNOSIS — Z299 Encounter for prophylactic measures, unspecified: Secondary | ICD-10-CM | POA: Diagnosis not present

## 2017-06-10 DIAGNOSIS — I10 Essential (primary) hypertension: Secondary | ICD-10-CM | POA: Diagnosis not present

## 2017-06-10 DIAGNOSIS — Z6835 Body mass index (BMI) 35.0-35.9, adult: Secondary | ICD-10-CM | POA: Diagnosis not present

## 2017-06-10 DIAGNOSIS — J449 Chronic obstructive pulmonary disease, unspecified: Secondary | ICD-10-CM | POA: Diagnosis not present

## 2017-06-12 DIAGNOSIS — D72829 Elevated white blood cell count, unspecified: Secondary | ICD-10-CM | POA: Diagnosis not present

## 2017-06-20 DIAGNOSIS — D72829 Elevated white blood cell count, unspecified: Secondary | ICD-10-CM | POA: Diagnosis not present

## 2017-06-20 DIAGNOSIS — J320 Chronic maxillary sinusitis: Secondary | ICD-10-CM | POA: Diagnosis not present

## 2017-06-20 DIAGNOSIS — J3489 Other specified disorders of nose and nasal sinuses: Secondary | ICD-10-CM | POA: Diagnosis not present

## 2017-07-01 DIAGNOSIS — M4716 Other spondylosis with myelopathy, lumbar region: Secondary | ICD-10-CM | POA: Diagnosis not present

## 2017-07-01 DIAGNOSIS — D72829 Elevated white blood cell count, unspecified: Secondary | ICD-10-CM | POA: Diagnosis not present

## 2017-07-01 DIAGNOSIS — M48062 Spinal stenosis, lumbar region with neurogenic claudication: Secondary | ICD-10-CM | POA: Diagnosis not present

## 2017-07-01 DIAGNOSIS — M4316 Spondylolisthesis, lumbar region: Secondary | ICD-10-CM | POA: Diagnosis not present

## 2017-07-15 DIAGNOSIS — M1990 Unspecified osteoarthritis, unspecified site: Secondary | ICD-10-CM | POA: Diagnosis not present

## 2017-07-15 DIAGNOSIS — D72828 Other elevated white blood cell count: Secondary | ICD-10-CM | POA: Diagnosis not present

## 2017-07-15 DIAGNOSIS — E1165 Type 2 diabetes mellitus with hyperglycemia: Secondary | ICD-10-CM | POA: Diagnosis not present

## 2017-07-15 DIAGNOSIS — M545 Low back pain: Secondary | ICD-10-CM | POA: Diagnosis not present

## 2017-07-15 DIAGNOSIS — D72829 Elevated white blood cell count, unspecified: Secondary | ICD-10-CM | POA: Diagnosis not present

## 2017-07-15 DIAGNOSIS — J449 Chronic obstructive pulmonary disease, unspecified: Secondary | ICD-10-CM | POA: Diagnosis not present

## 2017-07-15 DIAGNOSIS — Z6835 Body mass index (BMI) 35.0-35.9, adult: Secondary | ICD-10-CM | POA: Diagnosis not present

## 2017-07-15 DIAGNOSIS — M4316 Spondylolisthesis, lumbar region: Secondary | ICD-10-CM | POA: Diagnosis not present

## 2017-07-15 DIAGNOSIS — M2550 Pain in unspecified joint: Secondary | ICD-10-CM | POA: Diagnosis not present

## 2017-07-15 DIAGNOSIS — I10 Essential (primary) hypertension: Secondary | ICD-10-CM | POA: Diagnosis not present

## 2017-07-15 DIAGNOSIS — M48062 Spinal stenosis, lumbar region with neurogenic claudication: Secondary | ICD-10-CM | POA: Diagnosis not present

## 2017-07-15 DIAGNOSIS — R5383 Other fatigue: Secondary | ICD-10-CM | POA: Diagnosis not present

## 2017-07-15 DIAGNOSIS — Z299 Encounter for prophylactic measures, unspecified: Secondary | ICD-10-CM | POA: Diagnosis not present

## 2017-08-13 DIAGNOSIS — M79604 Pain in right leg: Secondary | ICD-10-CM | POA: Diagnosis not present

## 2017-08-13 DIAGNOSIS — Z299 Encounter for prophylactic measures, unspecified: Secondary | ICD-10-CM | POA: Diagnosis not present

## 2017-08-13 DIAGNOSIS — E1165 Type 2 diabetes mellitus with hyperglycemia: Secondary | ICD-10-CM | POA: Diagnosis not present

## 2017-08-13 DIAGNOSIS — Z6835 Body mass index (BMI) 35.0-35.9, adult: Secondary | ICD-10-CM | POA: Diagnosis not present

## 2017-08-13 DIAGNOSIS — J441 Chronic obstructive pulmonary disease with (acute) exacerbation: Secondary | ICD-10-CM | POA: Diagnosis not present

## 2017-08-13 DIAGNOSIS — I10 Essential (primary) hypertension: Secondary | ICD-10-CM | POA: Diagnosis not present

## 2017-08-13 DIAGNOSIS — M79605 Pain in left leg: Secondary | ICD-10-CM | POA: Diagnosis not present

## 2017-08-14 DIAGNOSIS — M4316 Spondylolisthesis, lumbar region: Secondary | ICD-10-CM | POA: Diagnosis not present

## 2017-08-14 DIAGNOSIS — M4716 Other spondylosis with myelopathy, lumbar region: Secondary | ICD-10-CM | POA: Diagnosis not present

## 2017-08-14 DIAGNOSIS — M48062 Spinal stenosis, lumbar region with neurogenic claudication: Secondary | ICD-10-CM | POA: Diagnosis not present

## 2017-09-04 DIAGNOSIS — I10 Essential (primary) hypertension: Secondary | ICD-10-CM | POA: Diagnosis not present

## 2017-09-04 DIAGNOSIS — E119 Type 2 diabetes mellitus without complications: Secondary | ICD-10-CM | POA: Diagnosis not present

## 2017-09-04 DIAGNOSIS — J449 Chronic obstructive pulmonary disease, unspecified: Secondary | ICD-10-CM | POA: Diagnosis not present

## 2017-09-05 DIAGNOSIS — Z6835 Body mass index (BMI) 35.0-35.9, adult: Secondary | ICD-10-CM | POA: Diagnosis not present

## 2017-09-05 DIAGNOSIS — B370 Candidal stomatitis: Secondary | ICD-10-CM | POA: Diagnosis not present

## 2017-09-05 DIAGNOSIS — E1142 Type 2 diabetes mellitus with diabetic polyneuropathy: Secondary | ICD-10-CM | POA: Diagnosis not present

## 2017-09-05 DIAGNOSIS — J449 Chronic obstructive pulmonary disease, unspecified: Secondary | ICD-10-CM | POA: Diagnosis not present

## 2017-09-05 DIAGNOSIS — E1122 Type 2 diabetes mellitus with diabetic chronic kidney disease: Secondary | ICD-10-CM | POA: Diagnosis not present

## 2017-09-05 DIAGNOSIS — N183 Chronic kidney disease, stage 3 (moderate): Secondary | ICD-10-CM | POA: Diagnosis not present

## 2017-09-05 DIAGNOSIS — Z299 Encounter for prophylactic measures, unspecified: Secondary | ICD-10-CM | POA: Diagnosis not present

## 2017-09-05 DIAGNOSIS — Z87891 Personal history of nicotine dependence: Secondary | ICD-10-CM | POA: Diagnosis not present

## 2017-09-05 DIAGNOSIS — E1165 Type 2 diabetes mellitus with hyperglycemia: Secondary | ICD-10-CM | POA: Diagnosis not present

## 2017-09-10 DIAGNOSIS — M4326 Fusion of spine, lumbar region: Secondary | ICD-10-CM | POA: Diagnosis not present

## 2017-09-12 DIAGNOSIS — Z79891 Long term (current) use of opiate analgesic: Secondary | ICD-10-CM | POA: Diagnosis not present

## 2017-09-12 DIAGNOSIS — M4316 Spondylolisthesis, lumbar region: Secondary | ICD-10-CM | POA: Diagnosis present

## 2017-09-12 DIAGNOSIS — Z7951 Long term (current) use of inhaled steroids: Secondary | ICD-10-CM | POA: Diagnosis not present

## 2017-09-12 DIAGNOSIS — K219 Gastro-esophageal reflux disease without esophagitis: Secondary | ICD-10-CM | POA: Diagnosis not present

## 2017-09-12 DIAGNOSIS — Z8249 Family history of ischemic heart disease and other diseases of the circulatory system: Secondary | ICD-10-CM | POA: Diagnosis not present

## 2017-09-12 DIAGNOSIS — M47817 Spondylosis without myelopathy or radiculopathy, lumbosacral region: Secondary | ICD-10-CM | POA: Diagnosis not present

## 2017-09-12 DIAGNOSIS — Z981 Arthrodesis status: Secondary | ICD-10-CM | POA: Diagnosis not present

## 2017-09-12 DIAGNOSIS — L299 Pruritus, unspecified: Secondary | ICD-10-CM | POA: Diagnosis present

## 2017-09-12 DIAGNOSIS — M47816 Spondylosis without myelopathy or radiculopathy, lumbar region: Secondary | ICD-10-CM | POA: Diagnosis not present

## 2017-09-12 DIAGNOSIS — Z79899 Other long term (current) drug therapy: Secondary | ICD-10-CM | POA: Diagnosis not present

## 2017-09-12 DIAGNOSIS — Z4789 Encounter for other orthopedic aftercare: Secondary | ICD-10-CM | POA: Diagnosis not present

## 2017-09-12 DIAGNOSIS — I10 Essential (primary) hypertension: Secondary | ICD-10-CM | POA: Diagnosis present

## 2017-09-12 DIAGNOSIS — E119 Type 2 diabetes mellitus without complications: Secondary | ICD-10-CM | POA: Diagnosis present

## 2017-09-12 DIAGNOSIS — M545 Low back pain: Secondary | ICD-10-CM | POA: Diagnosis not present

## 2017-09-12 DIAGNOSIS — M47896 Other spondylosis, lumbar region: Secondary | ICD-10-CM | POA: Diagnosis present

## 2017-09-12 DIAGNOSIS — M6281 Muscle weakness (generalized): Secondary | ICD-10-CM | POA: Diagnosis not present

## 2017-09-12 DIAGNOSIS — Z87891 Personal history of nicotine dependence: Secondary | ICD-10-CM | POA: Diagnosis not present

## 2017-09-12 DIAGNOSIS — R2689 Other abnormalities of gait and mobility: Secondary | ICD-10-CM | POA: Diagnosis not present

## 2017-09-12 DIAGNOSIS — M170 Bilateral primary osteoarthritis of knee: Secondary | ICD-10-CM | POA: Diagnosis present

## 2017-09-12 DIAGNOSIS — M4317 Spondylolisthesis, lumbosacral region: Secondary | ICD-10-CM | POA: Diagnosis present

## 2017-09-12 DIAGNOSIS — Z9109 Other allergy status, other than to drugs and biological substances: Secondary | ICD-10-CM | POA: Diagnosis not present

## 2017-09-12 DIAGNOSIS — J449 Chronic obstructive pulmonary disease, unspecified: Secondary | ICD-10-CM | POA: Diagnosis present

## 2017-09-12 DIAGNOSIS — Z881 Allergy status to other antibiotic agents status: Secondary | ICD-10-CM | POA: Diagnosis not present

## 2017-09-12 DIAGNOSIS — Z794 Long term (current) use of insulin: Secondary | ICD-10-CM | POA: Diagnosis not present

## 2017-09-18 DIAGNOSIS — M4326 Fusion of spine, lumbar region: Secondary | ICD-10-CM | POA: Diagnosis not present

## 2017-09-18 DIAGNOSIS — M4317 Spondylolisthesis, lumbosacral region: Secondary | ICD-10-CM | POA: Diagnosis not present

## 2017-09-18 DIAGNOSIS — E119 Type 2 diabetes mellitus without complications: Secondary | ICD-10-CM | POA: Diagnosis not present

## 2017-09-18 DIAGNOSIS — Z4789 Encounter for other orthopedic aftercare: Secondary | ICD-10-CM | POA: Diagnosis not present

## 2017-09-18 DIAGNOSIS — I10 Essential (primary) hypertension: Secondary | ICD-10-CM | POA: Diagnosis not present

## 2017-09-18 DIAGNOSIS — E1165 Type 2 diabetes mellitus with hyperglycemia: Secondary | ICD-10-CM | POA: Diagnosis not present

## 2017-09-18 DIAGNOSIS — M47896 Other spondylosis, lumbar region: Secondary | ICD-10-CM | POA: Diagnosis not present

## 2017-09-18 DIAGNOSIS — M4316 Spondylolisthesis, lumbar region: Secondary | ICD-10-CM | POA: Diagnosis not present

## 2017-09-18 DIAGNOSIS — M6281 Muscle weakness (generalized): Secondary | ICD-10-CM | POA: Diagnosis not present

## 2017-09-18 DIAGNOSIS — R2689 Other abnormalities of gait and mobility: Secondary | ICD-10-CM | POA: Diagnosis not present

## 2017-09-18 DIAGNOSIS — Z981 Arthrodesis status: Secondary | ICD-10-CM | POA: Diagnosis not present

## 2017-09-18 DIAGNOSIS — K219 Gastro-esophageal reflux disease without esophagitis: Secondary | ICD-10-CM | POA: Diagnosis not present

## 2017-09-18 DIAGNOSIS — J449 Chronic obstructive pulmonary disease, unspecified: Secondary | ICD-10-CM | POA: Diagnosis not present

## 2017-09-19 DIAGNOSIS — Z981 Arthrodesis status: Secondary | ICD-10-CM | POA: Diagnosis not present

## 2017-09-19 DIAGNOSIS — E119 Type 2 diabetes mellitus without complications: Secondary | ICD-10-CM | POA: Diagnosis not present

## 2017-09-19 DIAGNOSIS — I10 Essential (primary) hypertension: Secondary | ICD-10-CM | POA: Diagnosis not present

## 2017-09-29 DIAGNOSIS — E119 Type 2 diabetes mellitus without complications: Secondary | ICD-10-CM | POA: Diagnosis not present

## 2017-09-29 DIAGNOSIS — M4326 Fusion of spine, lumbar region: Secondary | ICD-10-CM | POA: Diagnosis not present

## 2017-10-02 DIAGNOSIS — E1165 Type 2 diabetes mellitus with hyperglycemia: Secondary | ICD-10-CM | POA: Diagnosis not present

## 2017-10-02 DIAGNOSIS — Z981 Arthrodesis status: Secondary | ICD-10-CM | POA: Diagnosis not present

## 2017-10-04 DIAGNOSIS — Z4789 Encounter for other orthopedic aftercare: Secondary | ICD-10-CM | POA: Diagnosis not present

## 2017-10-04 DIAGNOSIS — I10 Essential (primary) hypertension: Secondary | ICD-10-CM | POA: Diagnosis not present

## 2017-10-04 DIAGNOSIS — M170 Bilateral primary osteoarthritis of knee: Secondary | ICD-10-CM | POA: Diagnosis not present

## 2017-10-04 DIAGNOSIS — J449 Chronic obstructive pulmonary disease, unspecified: Secondary | ICD-10-CM | POA: Diagnosis not present

## 2017-10-04 DIAGNOSIS — Z794 Long term (current) use of insulin: Secondary | ICD-10-CM | POA: Diagnosis not present

## 2017-10-04 DIAGNOSIS — E119 Type 2 diabetes mellitus without complications: Secondary | ICD-10-CM | POA: Diagnosis not present

## 2017-10-04 DIAGNOSIS — M4727 Other spondylosis with radiculopathy, lumbosacral region: Secondary | ICD-10-CM | POA: Diagnosis not present

## 2017-10-05 DIAGNOSIS — I10 Essential (primary) hypertension: Secondary | ICD-10-CM | POA: Diagnosis not present

## 2017-10-05 DIAGNOSIS — M4727 Other spondylosis with radiculopathy, lumbosacral region: Secondary | ICD-10-CM | POA: Diagnosis not present

## 2017-10-05 DIAGNOSIS — E119 Type 2 diabetes mellitus without complications: Secondary | ICD-10-CM | POA: Diagnosis not present

## 2017-10-05 DIAGNOSIS — J449 Chronic obstructive pulmonary disease, unspecified: Secondary | ICD-10-CM | POA: Diagnosis not present

## 2017-10-05 DIAGNOSIS — Z4789 Encounter for other orthopedic aftercare: Secondary | ICD-10-CM | POA: Diagnosis not present

## 2017-10-05 DIAGNOSIS — M170 Bilateral primary osteoarthritis of knee: Secondary | ICD-10-CM | POA: Diagnosis not present

## 2017-10-07 DIAGNOSIS — M4727 Other spondylosis with radiculopathy, lumbosacral region: Secondary | ICD-10-CM | POA: Diagnosis not present

## 2017-10-07 DIAGNOSIS — J449 Chronic obstructive pulmonary disease, unspecified: Secondary | ICD-10-CM | POA: Diagnosis not present

## 2017-10-07 DIAGNOSIS — Z4789 Encounter for other orthopedic aftercare: Secondary | ICD-10-CM | POA: Diagnosis not present

## 2017-10-07 DIAGNOSIS — E119 Type 2 diabetes mellitus without complications: Secondary | ICD-10-CM | POA: Diagnosis not present

## 2017-10-07 DIAGNOSIS — M170 Bilateral primary osteoarthritis of knee: Secondary | ICD-10-CM | POA: Diagnosis not present

## 2017-10-07 DIAGNOSIS — I10 Essential (primary) hypertension: Secondary | ICD-10-CM | POA: Diagnosis not present

## 2017-10-08 DIAGNOSIS — I10 Essential (primary) hypertension: Secondary | ICD-10-CM | POA: Diagnosis not present

## 2017-10-08 DIAGNOSIS — Z4789 Encounter for other orthopedic aftercare: Secondary | ICD-10-CM | POA: Diagnosis not present

## 2017-10-08 DIAGNOSIS — M4727 Other spondylosis with radiculopathy, lumbosacral region: Secondary | ICD-10-CM | POA: Diagnosis not present

## 2017-10-08 DIAGNOSIS — J449 Chronic obstructive pulmonary disease, unspecified: Secondary | ICD-10-CM | POA: Diagnosis not present

## 2017-10-08 DIAGNOSIS — M170 Bilateral primary osteoarthritis of knee: Secondary | ICD-10-CM | POA: Diagnosis not present

## 2017-10-08 DIAGNOSIS — E119 Type 2 diabetes mellitus without complications: Secondary | ICD-10-CM | POA: Diagnosis not present

## 2017-10-09 DIAGNOSIS — Z4789 Encounter for other orthopedic aftercare: Secondary | ICD-10-CM | POA: Diagnosis not present

## 2017-10-09 DIAGNOSIS — M170 Bilateral primary osteoarthritis of knee: Secondary | ICD-10-CM | POA: Diagnosis not present

## 2017-10-09 DIAGNOSIS — I10 Essential (primary) hypertension: Secondary | ICD-10-CM | POA: Diagnosis not present

## 2017-10-09 DIAGNOSIS — J449 Chronic obstructive pulmonary disease, unspecified: Secondary | ICD-10-CM | POA: Diagnosis not present

## 2017-10-09 DIAGNOSIS — E119 Type 2 diabetes mellitus without complications: Secondary | ICD-10-CM | POA: Diagnosis not present

## 2017-10-09 DIAGNOSIS — M4727 Other spondylosis with radiculopathy, lumbosacral region: Secondary | ICD-10-CM | POA: Diagnosis not present

## 2017-10-10 DIAGNOSIS — M170 Bilateral primary osteoarthritis of knee: Secondary | ICD-10-CM | POA: Diagnosis not present

## 2017-10-10 DIAGNOSIS — Z299 Encounter for prophylactic measures, unspecified: Secondary | ICD-10-CM | POA: Diagnosis not present

## 2017-10-10 DIAGNOSIS — J449 Chronic obstructive pulmonary disease, unspecified: Secondary | ICD-10-CM | POA: Diagnosis not present

## 2017-10-10 DIAGNOSIS — E1142 Type 2 diabetes mellitus with diabetic polyneuropathy: Secondary | ICD-10-CM | POA: Diagnosis not present

## 2017-10-10 DIAGNOSIS — E119 Type 2 diabetes mellitus without complications: Secondary | ICD-10-CM | POA: Diagnosis not present

## 2017-10-10 DIAGNOSIS — I10 Essential (primary) hypertension: Secondary | ICD-10-CM | POA: Diagnosis not present

## 2017-10-10 DIAGNOSIS — M47816 Spondylosis without myelopathy or radiculopathy, lumbar region: Secondary | ICD-10-CM | POA: Diagnosis not present

## 2017-10-10 DIAGNOSIS — E1165 Type 2 diabetes mellitus with hyperglycemia: Secondary | ICD-10-CM | POA: Diagnosis not present

## 2017-10-10 DIAGNOSIS — M4316 Spondylolisthesis, lumbar region: Secondary | ICD-10-CM | POA: Diagnosis not present

## 2017-10-10 DIAGNOSIS — Z9889 Other specified postprocedural states: Secondary | ICD-10-CM | POA: Diagnosis not present

## 2017-10-10 DIAGNOSIS — N183 Chronic kidney disease, stage 3 (moderate): Secondary | ICD-10-CM | POA: Diagnosis not present

## 2017-10-10 DIAGNOSIS — Z981 Arthrodesis status: Secondary | ICD-10-CM | POA: Diagnosis not present

## 2017-10-10 DIAGNOSIS — E1122 Type 2 diabetes mellitus with diabetic chronic kidney disease: Secondary | ICD-10-CM | POA: Diagnosis not present

## 2017-10-10 DIAGNOSIS — Z4789 Encounter for other orthopedic aftercare: Secondary | ICD-10-CM | POA: Diagnosis not present

## 2017-10-10 DIAGNOSIS — M4727 Other spondylosis with radiculopathy, lumbosacral region: Secondary | ICD-10-CM | POA: Diagnosis not present

## 2017-10-11 DIAGNOSIS — M170 Bilateral primary osteoarthritis of knee: Secondary | ICD-10-CM | POA: Diagnosis not present

## 2017-10-11 DIAGNOSIS — I10 Essential (primary) hypertension: Secondary | ICD-10-CM | POA: Diagnosis not present

## 2017-10-11 DIAGNOSIS — J449 Chronic obstructive pulmonary disease, unspecified: Secondary | ICD-10-CM | POA: Diagnosis not present

## 2017-10-11 DIAGNOSIS — M4727 Other spondylosis with radiculopathy, lumbosacral region: Secondary | ICD-10-CM | POA: Diagnosis not present

## 2017-10-11 DIAGNOSIS — Z4789 Encounter for other orthopedic aftercare: Secondary | ICD-10-CM | POA: Diagnosis not present

## 2017-10-11 DIAGNOSIS — E119 Type 2 diabetes mellitus without complications: Secondary | ICD-10-CM | POA: Diagnosis not present

## 2017-10-15 DIAGNOSIS — R9431 Abnormal electrocardiogram [ECG] [EKG]: Secondary | ICD-10-CM | POA: Diagnosis not present

## 2017-10-15 DIAGNOSIS — A419 Sepsis, unspecified organism: Secondary | ICD-10-CM | POA: Diagnosis not present

## 2017-10-15 DIAGNOSIS — Z794 Long term (current) use of insulin: Secondary | ICD-10-CM | POA: Diagnosis not present

## 2017-10-15 DIAGNOSIS — E1140 Type 2 diabetes mellitus with diabetic neuropathy, unspecified: Secondary | ICD-10-CM | POA: Diagnosis present

## 2017-10-15 DIAGNOSIS — A09 Infectious gastroenteritis and colitis, unspecified: Secondary | ICD-10-CM | POA: Diagnosis present

## 2017-10-15 DIAGNOSIS — R509 Fever, unspecified: Secondary | ICD-10-CM | POA: Diagnosis not present

## 2017-10-15 DIAGNOSIS — T8140XA Infection following a procedure, unspecified, initial encounter: Secondary | ICD-10-CM | POA: Diagnosis not present

## 2017-10-15 DIAGNOSIS — I10 Essential (primary) hypertension: Secondary | ICD-10-CM | POA: Diagnosis present

## 2017-10-15 DIAGNOSIS — R6521 Severe sepsis with septic shock: Secondary | ICD-10-CM | POA: Diagnosis not present

## 2017-10-15 DIAGNOSIS — S31000A Unspecified open wound of lower back and pelvis without penetration into retroperitoneum, initial encounter: Secondary | ICD-10-CM | POA: Diagnosis not present

## 2017-10-15 DIAGNOSIS — M25551 Pain in right hip: Secondary | ICD-10-CM | POA: Diagnosis not present

## 2017-10-15 DIAGNOSIS — Z87891 Personal history of nicotine dependence: Secondary | ICD-10-CM | POA: Diagnosis not present

## 2017-10-15 DIAGNOSIS — E118 Type 2 diabetes mellitus with unspecified complications: Secondary | ICD-10-CM | POA: Diagnosis not present

## 2017-10-15 DIAGNOSIS — Z9889 Other specified postprocedural states: Secondary | ICD-10-CM | POA: Diagnosis not present

## 2017-10-15 DIAGNOSIS — Z79899 Other long term (current) drug therapy: Secondary | ICD-10-CM | POA: Diagnosis not present

## 2017-10-15 DIAGNOSIS — D649 Anemia, unspecified: Secondary | ICD-10-CM | POA: Diagnosis not present

## 2017-10-15 DIAGNOSIS — K219 Gastro-esophageal reflux disease without esophagitis: Secondary | ICD-10-CM | POA: Diagnosis present

## 2017-10-15 DIAGNOSIS — M25552 Pain in left hip: Secondary | ICD-10-CM | POA: Diagnosis not present

## 2017-10-15 DIAGNOSIS — J449 Chronic obstructive pulmonary disease, unspecified: Secondary | ICD-10-CM | POA: Diagnosis present

## 2017-10-19 DIAGNOSIS — E119 Type 2 diabetes mellitus without complications: Secondary | ICD-10-CM | POA: Diagnosis not present

## 2017-10-19 DIAGNOSIS — I10 Essential (primary) hypertension: Secondary | ICD-10-CM | POA: Diagnosis not present

## 2017-10-19 DIAGNOSIS — M170 Bilateral primary osteoarthritis of knee: Secondary | ICD-10-CM | POA: Diagnosis not present

## 2017-10-19 DIAGNOSIS — Z4789 Encounter for other orthopedic aftercare: Secondary | ICD-10-CM | POA: Diagnosis not present

## 2017-10-19 DIAGNOSIS — J449 Chronic obstructive pulmonary disease, unspecified: Secondary | ICD-10-CM | POA: Diagnosis not present

## 2017-10-19 DIAGNOSIS — M4727 Other spondylosis with radiculopathy, lumbosacral region: Secondary | ICD-10-CM | POA: Diagnosis not present

## 2017-10-22 DIAGNOSIS — D72829 Elevated white blood cell count, unspecified: Secondary | ICD-10-CM | POA: Diagnosis not present

## 2017-10-22 DIAGNOSIS — I10 Essential (primary) hypertension: Secondary | ICD-10-CM | POA: Diagnosis not present

## 2017-10-22 DIAGNOSIS — E119 Type 2 diabetes mellitus without complications: Secondary | ICD-10-CM | POA: Diagnosis not present

## 2017-10-22 DIAGNOSIS — J449 Chronic obstructive pulmonary disease, unspecified: Secondary | ICD-10-CM | POA: Diagnosis not present

## 2017-10-22 DIAGNOSIS — M4727 Other spondylosis with radiculopathy, lumbosacral region: Secondary | ICD-10-CM | POA: Diagnosis not present

## 2017-10-22 DIAGNOSIS — R197 Diarrhea, unspecified: Secondary | ICD-10-CM | POA: Diagnosis not present

## 2017-10-22 DIAGNOSIS — Z299 Encounter for prophylactic measures, unspecified: Secondary | ICD-10-CM | POA: Diagnosis not present

## 2017-10-22 DIAGNOSIS — Z6834 Body mass index (BMI) 34.0-34.9, adult: Secondary | ICD-10-CM | POA: Diagnosis not present

## 2017-10-22 DIAGNOSIS — M170 Bilateral primary osteoarthritis of knee: Secondary | ICD-10-CM | POA: Diagnosis not present

## 2017-10-22 DIAGNOSIS — M545 Low back pain: Secondary | ICD-10-CM | POA: Diagnosis not present

## 2017-10-22 DIAGNOSIS — Z4789 Encounter for other orthopedic aftercare: Secondary | ICD-10-CM | POA: Diagnosis not present

## 2017-10-22 DIAGNOSIS — R5383 Other fatigue: Secondary | ICD-10-CM | POA: Diagnosis not present

## 2017-10-23 DIAGNOSIS — I10 Essential (primary) hypertension: Secondary | ICD-10-CM | POA: Diagnosis not present

## 2017-10-23 DIAGNOSIS — J449 Chronic obstructive pulmonary disease, unspecified: Secondary | ICD-10-CM | POA: Diagnosis not present

## 2017-10-23 DIAGNOSIS — E119 Type 2 diabetes mellitus without complications: Secondary | ICD-10-CM | POA: Diagnosis not present

## 2017-10-23 DIAGNOSIS — M170 Bilateral primary osteoarthritis of knee: Secondary | ICD-10-CM | POA: Diagnosis not present

## 2017-10-23 DIAGNOSIS — Z4789 Encounter for other orthopedic aftercare: Secondary | ICD-10-CM | POA: Diagnosis not present

## 2017-10-23 DIAGNOSIS — M4727 Other spondylosis with radiculopathy, lumbosacral region: Secondary | ICD-10-CM | POA: Diagnosis not present

## 2017-10-24 DIAGNOSIS — M170 Bilateral primary osteoarthritis of knee: Secondary | ICD-10-CM | POA: Diagnosis not present

## 2017-10-24 DIAGNOSIS — J449 Chronic obstructive pulmonary disease, unspecified: Secondary | ICD-10-CM | POA: Diagnosis not present

## 2017-10-24 DIAGNOSIS — E119 Type 2 diabetes mellitus without complications: Secondary | ICD-10-CM | POA: Diagnosis not present

## 2017-10-24 DIAGNOSIS — M4727 Other spondylosis with radiculopathy, lumbosacral region: Secondary | ICD-10-CM | POA: Diagnosis not present

## 2017-10-24 DIAGNOSIS — Z4789 Encounter for other orthopedic aftercare: Secondary | ICD-10-CM | POA: Diagnosis not present

## 2017-10-24 DIAGNOSIS — I10 Essential (primary) hypertension: Secondary | ICD-10-CM | POA: Diagnosis not present

## 2017-10-25 DIAGNOSIS — E119 Type 2 diabetes mellitus without complications: Secondary | ICD-10-CM | POA: Diagnosis not present

## 2017-10-25 DIAGNOSIS — M4727 Other spondylosis with radiculopathy, lumbosacral region: Secondary | ICD-10-CM | POA: Diagnosis not present

## 2017-10-25 DIAGNOSIS — I10 Essential (primary) hypertension: Secondary | ICD-10-CM | POA: Diagnosis not present

## 2017-10-25 DIAGNOSIS — M170 Bilateral primary osteoarthritis of knee: Secondary | ICD-10-CM | POA: Diagnosis not present

## 2017-10-25 DIAGNOSIS — J449 Chronic obstructive pulmonary disease, unspecified: Secondary | ICD-10-CM | POA: Diagnosis not present

## 2017-10-25 DIAGNOSIS — Z4789 Encounter for other orthopedic aftercare: Secondary | ICD-10-CM | POA: Diagnosis not present

## 2017-10-28 DIAGNOSIS — I10 Essential (primary) hypertension: Secondary | ICD-10-CM | POA: Diagnosis not present

## 2017-10-28 DIAGNOSIS — E119 Type 2 diabetes mellitus without complications: Secondary | ICD-10-CM | POA: Diagnosis not present

## 2017-10-28 DIAGNOSIS — Z4789 Encounter for other orthopedic aftercare: Secondary | ICD-10-CM | POA: Diagnosis not present

## 2017-10-28 DIAGNOSIS — M170 Bilateral primary osteoarthritis of knee: Secondary | ICD-10-CM | POA: Diagnosis not present

## 2017-10-28 DIAGNOSIS — M4727 Other spondylosis with radiculopathy, lumbosacral region: Secondary | ICD-10-CM | POA: Diagnosis not present

## 2017-10-28 DIAGNOSIS — J449 Chronic obstructive pulmonary disease, unspecified: Secondary | ICD-10-CM | POA: Diagnosis not present

## 2017-10-29 DIAGNOSIS — Z4789 Encounter for other orthopedic aftercare: Secondary | ICD-10-CM | POA: Diagnosis not present

## 2017-10-29 DIAGNOSIS — I10 Essential (primary) hypertension: Secondary | ICD-10-CM | POA: Diagnosis not present

## 2017-10-29 DIAGNOSIS — E119 Type 2 diabetes mellitus without complications: Secondary | ICD-10-CM | POA: Diagnosis not present

## 2017-10-29 DIAGNOSIS — M170 Bilateral primary osteoarthritis of knee: Secondary | ICD-10-CM | POA: Diagnosis not present

## 2017-10-29 DIAGNOSIS — J449 Chronic obstructive pulmonary disease, unspecified: Secondary | ICD-10-CM | POA: Diagnosis not present

## 2017-10-29 DIAGNOSIS — M4727 Other spondylosis with radiculopathy, lumbosacral region: Secondary | ICD-10-CM | POA: Diagnosis not present

## 2017-10-30 DIAGNOSIS — M170 Bilateral primary osteoarthritis of knee: Secondary | ICD-10-CM | POA: Diagnosis not present

## 2017-10-30 DIAGNOSIS — I10 Essential (primary) hypertension: Secondary | ICD-10-CM | POA: Diagnosis not present

## 2017-10-30 DIAGNOSIS — E119 Type 2 diabetes mellitus without complications: Secondary | ICD-10-CM | POA: Diagnosis not present

## 2017-10-30 DIAGNOSIS — M4727 Other spondylosis with radiculopathy, lumbosacral region: Secondary | ICD-10-CM | POA: Diagnosis not present

## 2017-10-30 DIAGNOSIS — J449 Chronic obstructive pulmonary disease, unspecified: Secondary | ICD-10-CM | POA: Diagnosis not present

## 2017-10-30 DIAGNOSIS — Z4789 Encounter for other orthopedic aftercare: Secondary | ICD-10-CM | POA: Diagnosis not present

## 2017-10-31 DIAGNOSIS — Z4789 Encounter for other orthopedic aftercare: Secondary | ICD-10-CM | POA: Diagnosis not present

## 2017-10-31 DIAGNOSIS — J449 Chronic obstructive pulmonary disease, unspecified: Secondary | ICD-10-CM | POA: Diagnosis not present

## 2017-10-31 DIAGNOSIS — M4727 Other spondylosis with radiculopathy, lumbosacral region: Secondary | ICD-10-CM | POA: Diagnosis not present

## 2017-10-31 DIAGNOSIS — E119 Type 2 diabetes mellitus without complications: Secondary | ICD-10-CM | POA: Diagnosis not present

## 2017-10-31 DIAGNOSIS — I10 Essential (primary) hypertension: Secondary | ICD-10-CM | POA: Diagnosis not present

## 2017-10-31 DIAGNOSIS — M170 Bilateral primary osteoarthritis of knee: Secondary | ICD-10-CM | POA: Diagnosis not present

## 2017-11-04 DIAGNOSIS — M170 Bilateral primary osteoarthritis of knee: Secondary | ICD-10-CM | POA: Diagnosis not present

## 2017-11-04 DIAGNOSIS — J449 Chronic obstructive pulmonary disease, unspecified: Secondary | ICD-10-CM | POA: Diagnosis not present

## 2017-11-04 DIAGNOSIS — I10 Essential (primary) hypertension: Secondary | ICD-10-CM | POA: Diagnosis not present

## 2017-11-04 DIAGNOSIS — M4727 Other spondylosis with radiculopathy, lumbosacral region: Secondary | ICD-10-CM | POA: Diagnosis not present

## 2017-11-04 DIAGNOSIS — E119 Type 2 diabetes mellitus without complications: Secondary | ICD-10-CM | POA: Diagnosis not present

## 2017-11-04 DIAGNOSIS — Z4789 Encounter for other orthopedic aftercare: Secondary | ICD-10-CM | POA: Diagnosis not present

## 2017-11-05 DIAGNOSIS — J449 Chronic obstructive pulmonary disease, unspecified: Secondary | ICD-10-CM | POA: Diagnosis not present

## 2017-11-05 DIAGNOSIS — I10 Essential (primary) hypertension: Secondary | ICD-10-CM | POA: Diagnosis not present

## 2017-11-05 DIAGNOSIS — E119 Type 2 diabetes mellitus without complications: Secondary | ICD-10-CM | POA: Diagnosis not present

## 2017-11-05 DIAGNOSIS — M4727 Other spondylosis with radiculopathy, lumbosacral region: Secondary | ICD-10-CM | POA: Diagnosis not present

## 2017-11-05 DIAGNOSIS — Z4789 Encounter for other orthopedic aftercare: Secondary | ICD-10-CM | POA: Diagnosis not present

## 2017-11-05 DIAGNOSIS — M170 Bilateral primary osteoarthritis of knee: Secondary | ICD-10-CM | POA: Diagnosis not present

## 2017-11-07 DIAGNOSIS — E1165 Type 2 diabetes mellitus with hyperglycemia: Secondary | ICD-10-CM | POA: Diagnosis not present

## 2017-11-07 DIAGNOSIS — Z6834 Body mass index (BMI) 34.0-34.9, adult: Secondary | ICD-10-CM | POA: Diagnosis not present

## 2017-11-07 DIAGNOSIS — N183 Chronic kidney disease, stage 3 (moderate): Secondary | ICD-10-CM | POA: Diagnosis not present

## 2017-11-07 DIAGNOSIS — M170 Bilateral primary osteoarthritis of knee: Secondary | ICD-10-CM | POA: Diagnosis not present

## 2017-11-07 DIAGNOSIS — D500 Iron deficiency anemia secondary to blood loss (chronic): Secondary | ICD-10-CM | POA: Diagnosis not present

## 2017-11-07 DIAGNOSIS — E1142 Type 2 diabetes mellitus with diabetic polyneuropathy: Secondary | ICD-10-CM | POA: Diagnosis not present

## 2017-11-07 DIAGNOSIS — Z299 Encounter for prophylactic measures, unspecified: Secondary | ICD-10-CM | POA: Diagnosis not present

## 2017-11-07 DIAGNOSIS — E119 Type 2 diabetes mellitus without complications: Secondary | ICD-10-CM | POA: Diagnosis not present

## 2017-11-07 DIAGNOSIS — I10 Essential (primary) hypertension: Secondary | ICD-10-CM | POA: Diagnosis not present

## 2017-11-07 DIAGNOSIS — J449 Chronic obstructive pulmonary disease, unspecified: Secondary | ICD-10-CM | POA: Diagnosis not present

## 2017-11-07 DIAGNOSIS — E1122 Type 2 diabetes mellitus with diabetic chronic kidney disease: Secondary | ICD-10-CM | POA: Diagnosis not present

## 2017-11-07 DIAGNOSIS — M4727 Other spondylosis with radiculopathy, lumbosacral region: Secondary | ICD-10-CM | POA: Diagnosis not present

## 2017-11-07 DIAGNOSIS — D72829 Elevated white blood cell count, unspecified: Secondary | ICD-10-CM | POA: Diagnosis not present

## 2017-11-07 DIAGNOSIS — Z4789 Encounter for other orthopedic aftercare: Secondary | ICD-10-CM | POA: Diagnosis not present

## 2017-11-08 DIAGNOSIS — I10 Essential (primary) hypertension: Secondary | ICD-10-CM | POA: Diagnosis not present

## 2017-11-08 DIAGNOSIS — E119 Type 2 diabetes mellitus without complications: Secondary | ICD-10-CM | POA: Diagnosis not present

## 2017-11-08 DIAGNOSIS — Z4789 Encounter for other orthopedic aftercare: Secondary | ICD-10-CM | POA: Diagnosis not present

## 2017-11-08 DIAGNOSIS — M4727 Other spondylosis with radiculopathy, lumbosacral region: Secondary | ICD-10-CM | POA: Diagnosis not present

## 2017-11-08 DIAGNOSIS — J449 Chronic obstructive pulmonary disease, unspecified: Secondary | ICD-10-CM | POA: Diagnosis not present

## 2017-11-08 DIAGNOSIS — M170 Bilateral primary osteoarthritis of knee: Secondary | ICD-10-CM | POA: Diagnosis not present

## 2017-11-13 DIAGNOSIS — I10 Essential (primary) hypertension: Secondary | ICD-10-CM | POA: Diagnosis not present

## 2017-11-13 DIAGNOSIS — Z4789 Encounter for other orthopedic aftercare: Secondary | ICD-10-CM | POA: Diagnosis not present

## 2017-11-13 DIAGNOSIS — E119 Type 2 diabetes mellitus without complications: Secondary | ICD-10-CM | POA: Diagnosis not present

## 2017-11-13 DIAGNOSIS — M170 Bilateral primary osteoarthritis of knee: Secondary | ICD-10-CM | POA: Diagnosis not present

## 2017-11-13 DIAGNOSIS — J449 Chronic obstructive pulmonary disease, unspecified: Secondary | ICD-10-CM | POA: Diagnosis not present

## 2017-11-13 DIAGNOSIS — M4727 Other spondylosis with radiculopathy, lumbosacral region: Secondary | ICD-10-CM | POA: Diagnosis not present

## 2017-11-19 DIAGNOSIS — E119 Type 2 diabetes mellitus without complications: Secondary | ICD-10-CM | POA: Diagnosis not present

## 2017-11-19 DIAGNOSIS — I10 Essential (primary) hypertension: Secondary | ICD-10-CM | POA: Diagnosis not present

## 2017-11-19 DIAGNOSIS — M4727 Other spondylosis with radiculopathy, lumbosacral region: Secondary | ICD-10-CM | POA: Diagnosis not present

## 2017-11-19 DIAGNOSIS — M170 Bilateral primary osteoarthritis of knee: Secondary | ICD-10-CM | POA: Diagnosis not present

## 2017-11-19 DIAGNOSIS — Z4789 Encounter for other orthopedic aftercare: Secondary | ICD-10-CM | POA: Diagnosis not present

## 2017-11-19 DIAGNOSIS — J449 Chronic obstructive pulmonary disease, unspecified: Secondary | ICD-10-CM | POA: Diagnosis not present

## 2017-11-21 DIAGNOSIS — E669 Obesity, unspecified: Secondary | ICD-10-CM | POA: Diagnosis not present

## 2017-11-21 DIAGNOSIS — M2550 Pain in unspecified joint: Secondary | ICD-10-CM | POA: Diagnosis not present

## 2017-11-21 DIAGNOSIS — Z9889 Other specified postprocedural states: Secondary | ICD-10-CM | POA: Diagnosis not present

## 2017-11-21 DIAGNOSIS — Z7952 Long term (current) use of systemic steroids: Secondary | ICD-10-CM | POA: Diagnosis not present

## 2017-11-21 DIAGNOSIS — Z86711 Personal history of pulmonary embolism: Secondary | ICD-10-CM | POA: Diagnosis not present

## 2017-11-21 DIAGNOSIS — Z794 Long term (current) use of insulin: Secondary | ICD-10-CM | POA: Diagnosis not present

## 2017-11-21 DIAGNOSIS — J449 Chronic obstructive pulmonary disease, unspecified: Secondary | ICD-10-CM | POA: Diagnosis not present

## 2017-11-21 DIAGNOSIS — Z79899 Other long term (current) drug therapy: Secondary | ICD-10-CM | POA: Diagnosis not present

## 2017-11-21 DIAGNOSIS — E119 Type 2 diabetes mellitus without complications: Secondary | ICD-10-CM | POA: Diagnosis not present

## 2017-11-21 DIAGNOSIS — K759 Inflammatory liver disease, unspecified: Secondary | ICD-10-CM | POA: Diagnosis not present

## 2017-11-21 DIAGNOSIS — Z882 Allergy status to sulfonamides status: Secondary | ICD-10-CM | POA: Diagnosis not present

## 2017-11-21 DIAGNOSIS — I10 Essential (primary) hypertension: Secondary | ICD-10-CM | POA: Diagnosis not present

## 2017-11-21 DIAGNOSIS — Z87891 Personal history of nicotine dependence: Secondary | ICD-10-CM | POA: Diagnosis not present

## 2017-11-21 DIAGNOSIS — R748 Abnormal levels of other serum enzymes: Secondary | ICD-10-CM | POA: Diagnosis not present

## 2017-11-21 DIAGNOSIS — D509 Iron deficiency anemia, unspecified: Secondary | ICD-10-CM | POA: Diagnosis not present

## 2017-11-29 DIAGNOSIS — Z4789 Encounter for other orthopedic aftercare: Secondary | ICD-10-CM | POA: Diagnosis not present

## 2017-11-29 DIAGNOSIS — E119 Type 2 diabetes mellitus without complications: Secondary | ICD-10-CM | POA: Diagnosis not present

## 2017-11-29 DIAGNOSIS — J449 Chronic obstructive pulmonary disease, unspecified: Secondary | ICD-10-CM | POA: Diagnosis not present

## 2017-11-29 DIAGNOSIS — Z79891 Long term (current) use of opiate analgesic: Secondary | ICD-10-CM | POA: Diagnosis not present

## 2017-11-29 DIAGNOSIS — I10 Essential (primary) hypertension: Secondary | ICD-10-CM | POA: Diagnosis not present

## 2017-11-29 DIAGNOSIS — M4716 Other spondylosis with myelopathy, lumbar region: Secondary | ICD-10-CM | POA: Diagnosis not present

## 2017-11-29 DIAGNOSIS — M170 Bilateral primary osteoarthritis of knee: Secondary | ICD-10-CM | POA: Diagnosis not present

## 2017-11-29 DIAGNOSIS — M4727 Other spondylosis with radiculopathy, lumbosacral region: Secondary | ICD-10-CM | POA: Diagnosis not present

## 2017-12-03 DIAGNOSIS — R748 Abnormal levels of other serum enzymes: Secondary | ICD-10-CM | POA: Diagnosis not present

## 2017-12-03 DIAGNOSIS — M4716 Other spondylosis with myelopathy, lumbar region: Secondary | ICD-10-CM | POA: Diagnosis not present

## 2017-12-03 DIAGNOSIS — M79661 Pain in right lower leg: Secondary | ICD-10-CM | POA: Diagnosis not present

## 2017-12-03 DIAGNOSIS — Z9049 Acquired absence of other specified parts of digestive tract: Secondary | ICD-10-CM | POA: Diagnosis not present

## 2017-12-03 DIAGNOSIS — M79604 Pain in right leg: Secondary | ICD-10-CM | POA: Diagnosis not present

## 2017-12-03 DIAGNOSIS — M79605 Pain in left leg: Secondary | ICD-10-CM | POA: Diagnosis not present

## 2017-12-03 DIAGNOSIS — M79662 Pain in left lower leg: Secondary | ICD-10-CM | POA: Diagnosis not present

## 2017-12-03 DIAGNOSIS — R1011 Right upper quadrant pain: Secondary | ICD-10-CM | POA: Diagnosis not present

## 2017-12-05 DIAGNOSIS — Z981 Arthrodesis status: Secondary | ICD-10-CM | POA: Diagnosis not present

## 2017-12-05 DIAGNOSIS — M4326 Fusion of spine, lumbar region: Secondary | ICD-10-CM | POA: Diagnosis not present

## 2017-12-05 DIAGNOSIS — M5187 Other intervertebral disc disorders, lumbosacral region: Secondary | ICD-10-CM | POA: Diagnosis not present

## 2017-12-05 DIAGNOSIS — M4716 Other spondylosis with myelopathy, lumbar region: Secondary | ICD-10-CM | POA: Diagnosis not present

## 2017-12-13 DIAGNOSIS — Z09 Encounter for follow-up examination after completed treatment for conditions other than malignant neoplasm: Secondary | ICD-10-CM | POA: Diagnosis not present

## 2017-12-13 DIAGNOSIS — M4807 Spinal stenosis, lumbosacral region: Secondary | ICD-10-CM | POA: Diagnosis not present

## 2017-12-13 DIAGNOSIS — M4716 Other spondylosis with myelopathy, lumbar region: Secondary | ICD-10-CM | POA: Diagnosis not present

## 2017-12-19 DIAGNOSIS — J449 Chronic obstructive pulmonary disease, unspecified: Secondary | ICD-10-CM | POA: Diagnosis not present

## 2017-12-19 DIAGNOSIS — I10 Essential (primary) hypertension: Secondary | ICD-10-CM | POA: Diagnosis not present

## 2017-12-19 DIAGNOSIS — E119 Type 2 diabetes mellitus without complications: Secondary | ICD-10-CM | POA: Diagnosis not present

## 2017-12-24 DIAGNOSIS — D72825 Bandemia: Secondary | ICD-10-CM | POA: Diagnosis not present

## 2018-01-10 DIAGNOSIS — M4317 Spondylolisthesis, lumbosacral region: Secondary | ICD-10-CM | POA: Diagnosis not present

## 2018-01-10 DIAGNOSIS — M4716 Other spondylosis with myelopathy, lumbar region: Secondary | ICD-10-CM | POA: Diagnosis not present

## 2018-01-17 DIAGNOSIS — I10 Essential (primary) hypertension: Secondary | ICD-10-CM | POA: Diagnosis not present

## 2018-01-17 DIAGNOSIS — J449 Chronic obstructive pulmonary disease, unspecified: Secondary | ICD-10-CM | POA: Diagnosis not present

## 2018-01-17 DIAGNOSIS — E119 Type 2 diabetes mellitus without complications: Secondary | ICD-10-CM | POA: Diagnosis not present

## 2018-02-13 DIAGNOSIS — E1142 Type 2 diabetes mellitus with diabetic polyneuropathy: Secondary | ICD-10-CM | POA: Diagnosis not present

## 2018-02-13 DIAGNOSIS — Z299 Encounter for prophylactic measures, unspecified: Secondary | ICD-10-CM | POA: Diagnosis not present

## 2018-02-13 DIAGNOSIS — E1122 Type 2 diabetes mellitus with diabetic chronic kidney disease: Secondary | ICD-10-CM | POA: Diagnosis not present

## 2018-02-13 DIAGNOSIS — I10 Essential (primary) hypertension: Secondary | ICD-10-CM | POA: Diagnosis not present

## 2018-02-13 DIAGNOSIS — E1165 Type 2 diabetes mellitus with hyperglycemia: Secondary | ICD-10-CM | POA: Diagnosis not present

## 2018-02-13 DIAGNOSIS — Z6833 Body mass index (BMI) 33.0-33.9, adult: Secondary | ICD-10-CM | POA: Diagnosis not present

## 2018-02-13 DIAGNOSIS — N183 Chronic kidney disease, stage 3 (moderate): Secondary | ICD-10-CM | POA: Diagnosis not present

## 2018-03-05 DIAGNOSIS — E1165 Type 2 diabetes mellitus with hyperglycemia: Secondary | ICD-10-CM | POA: Diagnosis not present

## 2018-03-05 DIAGNOSIS — J449 Chronic obstructive pulmonary disease, unspecified: Secondary | ICD-10-CM | POA: Diagnosis not present

## 2018-03-05 DIAGNOSIS — Z87891 Personal history of nicotine dependence: Secondary | ICD-10-CM | POA: Diagnosis not present

## 2018-03-05 DIAGNOSIS — I10 Essential (primary) hypertension: Secondary | ICD-10-CM | POA: Diagnosis not present

## 2018-03-05 DIAGNOSIS — Z6833 Body mass index (BMI) 33.0-33.9, adult: Secondary | ICD-10-CM | POA: Diagnosis not present

## 2018-03-05 DIAGNOSIS — Z299 Encounter for prophylactic measures, unspecified: Secondary | ICD-10-CM | POA: Diagnosis not present

## 2018-03-14 DIAGNOSIS — I10 Essential (primary) hypertension: Secondary | ICD-10-CM | POA: Diagnosis not present

## 2018-03-14 DIAGNOSIS — J449 Chronic obstructive pulmonary disease, unspecified: Secondary | ICD-10-CM | POA: Diagnosis not present

## 2018-03-14 DIAGNOSIS — E119 Type 2 diabetes mellitus without complications: Secondary | ICD-10-CM | POA: Diagnosis not present

## 2018-03-20 DIAGNOSIS — Z6834 Body mass index (BMI) 34.0-34.9, adult: Secondary | ICD-10-CM | POA: Diagnosis not present

## 2018-03-20 DIAGNOSIS — I10 Essential (primary) hypertension: Secondary | ICD-10-CM | POA: Diagnosis not present

## 2018-03-20 DIAGNOSIS — J449 Chronic obstructive pulmonary disease, unspecified: Secondary | ICD-10-CM | POA: Diagnosis not present

## 2018-03-20 DIAGNOSIS — E1122 Type 2 diabetes mellitus with diabetic chronic kidney disease: Secondary | ICD-10-CM | POA: Diagnosis not present

## 2018-03-20 DIAGNOSIS — E1142 Type 2 diabetes mellitus with diabetic polyneuropathy: Secondary | ICD-10-CM | POA: Diagnosis not present

## 2018-03-20 DIAGNOSIS — Z299 Encounter for prophylactic measures, unspecified: Secondary | ICD-10-CM | POA: Diagnosis not present

## 2018-03-20 DIAGNOSIS — E1165 Type 2 diabetes mellitus with hyperglycemia: Secondary | ICD-10-CM | POA: Diagnosis not present

## 2018-03-20 DIAGNOSIS — Z2821 Immunization not carried out because of patient refusal: Secondary | ICD-10-CM | POA: Diagnosis not present

## 2018-03-31 DIAGNOSIS — H25811 Combined forms of age-related cataract, right eye: Secondary | ICD-10-CM | POA: Diagnosis not present

## 2018-03-31 DIAGNOSIS — E119 Type 2 diabetes mellitus without complications: Secondary | ICD-10-CM | POA: Diagnosis not present

## 2018-03-31 DIAGNOSIS — Z01818 Encounter for other preprocedural examination: Secondary | ICD-10-CM | POA: Diagnosis not present

## 2018-04-16 DIAGNOSIS — J449 Chronic obstructive pulmonary disease, unspecified: Secondary | ICD-10-CM | POA: Diagnosis not present

## 2018-04-16 DIAGNOSIS — E119 Type 2 diabetes mellitus without complications: Secondary | ICD-10-CM | POA: Diagnosis not present

## 2018-04-16 DIAGNOSIS — I10 Essential (primary) hypertension: Secondary | ICD-10-CM | POA: Diagnosis not present

## 2018-04-16 DIAGNOSIS — M4716 Other spondylosis with myelopathy, lumbar region: Secondary | ICD-10-CM | POA: Diagnosis not present

## 2018-04-16 DIAGNOSIS — M4317 Spondylolisthesis, lumbosacral region: Secondary | ICD-10-CM | POA: Diagnosis not present

## 2018-05-05 DIAGNOSIS — H25811 Combined forms of age-related cataract, right eye: Secondary | ICD-10-CM | POA: Diagnosis not present

## 2018-05-05 DIAGNOSIS — H2511 Age-related nuclear cataract, right eye: Secondary | ICD-10-CM | POA: Diagnosis not present

## 2018-05-05 DIAGNOSIS — E119 Type 2 diabetes mellitus without complications: Secondary | ICD-10-CM | POA: Diagnosis not present

## 2018-05-26 DIAGNOSIS — I10 Essential (primary) hypertension: Secondary | ICD-10-CM | POA: Diagnosis not present

## 2018-05-26 DIAGNOSIS — M7550 Bursitis of unspecified shoulder: Secondary | ICD-10-CM | POA: Diagnosis not present

## 2018-05-26 DIAGNOSIS — Z6834 Body mass index (BMI) 34.0-34.9, adult: Secondary | ICD-10-CM | POA: Diagnosis not present

## 2018-05-26 DIAGNOSIS — Z299 Encounter for prophylactic measures, unspecified: Secondary | ICD-10-CM | POA: Diagnosis not present

## 2018-05-26 DIAGNOSIS — N183 Chronic kidney disease, stage 3 (moderate): Secondary | ICD-10-CM | POA: Diagnosis not present

## 2018-05-26 DIAGNOSIS — E1122 Type 2 diabetes mellitus with diabetic chronic kidney disease: Secondary | ICD-10-CM | POA: Diagnosis not present

## 2018-05-26 DIAGNOSIS — E1165 Type 2 diabetes mellitus with hyperglycemia: Secondary | ICD-10-CM | POA: Diagnosis not present

## 2018-05-26 DIAGNOSIS — J449 Chronic obstructive pulmonary disease, unspecified: Secondary | ICD-10-CM | POA: Diagnosis not present

## 2018-05-26 DIAGNOSIS — M7552 Bursitis of left shoulder: Secondary | ICD-10-CM | POA: Diagnosis not present

## 2018-05-26 DIAGNOSIS — E1142 Type 2 diabetes mellitus with diabetic polyneuropathy: Secondary | ICD-10-CM | POA: Diagnosis not present

## 2018-06-04 DIAGNOSIS — Z1211 Encounter for screening for malignant neoplasm of colon: Secondary | ICD-10-CM | POA: Diagnosis not present

## 2018-06-04 DIAGNOSIS — Z7189 Other specified counseling: Secondary | ICD-10-CM | POA: Diagnosis not present

## 2018-06-04 DIAGNOSIS — I10 Essential (primary) hypertension: Secondary | ICD-10-CM | POA: Diagnosis not present

## 2018-06-04 DIAGNOSIS — J449 Chronic obstructive pulmonary disease, unspecified: Secondary | ICD-10-CM | POA: Diagnosis not present

## 2018-06-04 DIAGNOSIS — Z299 Encounter for prophylactic measures, unspecified: Secondary | ICD-10-CM | POA: Diagnosis not present

## 2018-06-04 DIAGNOSIS — Z6834 Body mass index (BMI) 34.0-34.9, adult: Secondary | ICD-10-CM | POA: Diagnosis not present

## 2018-06-04 DIAGNOSIS — Z1331 Encounter for screening for depression: Secondary | ICD-10-CM | POA: Diagnosis not present

## 2018-06-04 DIAGNOSIS — Z1339 Encounter for screening examination for other mental health and behavioral disorders: Secondary | ICD-10-CM | POA: Diagnosis not present

## 2018-06-04 DIAGNOSIS — Z79899 Other long term (current) drug therapy: Secondary | ICD-10-CM | POA: Diagnosis not present

## 2018-06-04 DIAGNOSIS — R5383 Other fatigue: Secondary | ICD-10-CM | POA: Diagnosis not present

## 2018-06-04 DIAGNOSIS — Z0000 Encounter for general adult medical examination without abnormal findings: Secondary | ICD-10-CM | POA: Diagnosis not present

## 2018-06-16 DIAGNOSIS — J449 Chronic obstructive pulmonary disease, unspecified: Secondary | ICD-10-CM | POA: Diagnosis not present

## 2018-06-16 DIAGNOSIS — I10 Essential (primary) hypertension: Secondary | ICD-10-CM | POA: Diagnosis not present

## 2018-06-16 DIAGNOSIS — E119 Type 2 diabetes mellitus without complications: Secondary | ICD-10-CM | POA: Diagnosis not present

## 2018-07-15 DIAGNOSIS — M4317 Spondylolisthesis, lumbosacral region: Secondary | ICD-10-CM | POA: Diagnosis not present

## 2018-07-15 DIAGNOSIS — M4716 Other spondylosis with myelopathy, lumbar region: Secondary | ICD-10-CM | POA: Diagnosis not present

## 2018-07-24 DIAGNOSIS — E119 Type 2 diabetes mellitus without complications: Secondary | ICD-10-CM | POA: Diagnosis not present

## 2018-07-24 DIAGNOSIS — J449 Chronic obstructive pulmonary disease, unspecified: Secondary | ICD-10-CM | POA: Diagnosis not present

## 2018-07-24 DIAGNOSIS — I10 Essential (primary) hypertension: Secondary | ICD-10-CM | POA: Diagnosis not present

## 2018-09-01 DIAGNOSIS — Z6834 Body mass index (BMI) 34.0-34.9, adult: Secondary | ICD-10-CM | POA: Diagnosis not present

## 2018-09-01 DIAGNOSIS — I10 Essential (primary) hypertension: Secondary | ICD-10-CM | POA: Diagnosis not present

## 2018-09-01 DIAGNOSIS — Z299 Encounter for prophylactic measures, unspecified: Secondary | ICD-10-CM | POA: Diagnosis not present

## 2018-09-01 DIAGNOSIS — J449 Chronic obstructive pulmonary disease, unspecified: Secondary | ICD-10-CM | POA: Diagnosis not present

## 2018-09-01 DIAGNOSIS — R945 Abnormal results of liver function studies: Secondary | ICD-10-CM | POA: Diagnosis not present

## 2018-09-01 DIAGNOSIS — E1142 Type 2 diabetes mellitus with diabetic polyneuropathy: Secondary | ICD-10-CM | POA: Diagnosis not present

## 2018-09-01 DIAGNOSIS — E1122 Type 2 diabetes mellitus with diabetic chronic kidney disease: Secondary | ICD-10-CM | POA: Diagnosis not present

## 2018-09-01 DIAGNOSIS — E1165 Type 2 diabetes mellitus with hyperglycemia: Secondary | ICD-10-CM | POA: Diagnosis not present

## 2018-09-01 DIAGNOSIS — D729 Disorder of white blood cells, unspecified: Secondary | ICD-10-CM | POA: Diagnosis not present

## 2018-09-30 DIAGNOSIS — J449 Chronic obstructive pulmonary disease, unspecified: Secondary | ICD-10-CM | POA: Diagnosis not present

## 2018-09-30 DIAGNOSIS — E119 Type 2 diabetes mellitus without complications: Secondary | ICD-10-CM | POA: Diagnosis not present

## 2018-09-30 DIAGNOSIS — I10 Essential (primary) hypertension: Secondary | ICD-10-CM | POA: Diagnosis not present

## 2018-10-13 DIAGNOSIS — I10 Essential (primary) hypertension: Secondary | ICD-10-CM | POA: Diagnosis not present

## 2018-10-13 DIAGNOSIS — E1165 Type 2 diabetes mellitus with hyperglycemia: Secondary | ICD-10-CM | POA: Diagnosis not present

## 2018-10-13 DIAGNOSIS — Z299 Encounter for prophylactic measures, unspecified: Secondary | ICD-10-CM | POA: Diagnosis not present

## 2018-10-13 DIAGNOSIS — N898 Other specified noninflammatory disorders of vagina: Secondary | ICD-10-CM | POA: Diagnosis not present

## 2018-10-13 DIAGNOSIS — Z6834 Body mass index (BMI) 34.0-34.9, adult: Secondary | ICD-10-CM | POA: Diagnosis not present

## 2018-10-13 DIAGNOSIS — M659 Synovitis and tenosynovitis, unspecified: Secondary | ICD-10-CM | POA: Diagnosis not present

## 2018-10-13 DIAGNOSIS — B373 Candidiasis of vulva and vagina: Secondary | ICD-10-CM | POA: Diagnosis not present

## 2018-11-07 DIAGNOSIS — I10 Essential (primary) hypertension: Secondary | ICD-10-CM | POA: Diagnosis not present

## 2018-11-07 DIAGNOSIS — J449 Chronic obstructive pulmonary disease, unspecified: Secondary | ICD-10-CM | POA: Diagnosis not present

## 2018-11-07 DIAGNOSIS — E119 Type 2 diabetes mellitus without complications: Secondary | ICD-10-CM | POA: Diagnosis not present

## 2018-12-08 DIAGNOSIS — Z299 Encounter for prophylactic measures, unspecified: Secondary | ICD-10-CM | POA: Diagnosis not present

## 2018-12-08 DIAGNOSIS — Z6834 Body mass index (BMI) 34.0-34.9, adult: Secondary | ICD-10-CM | POA: Diagnosis not present

## 2018-12-08 DIAGNOSIS — E1122 Type 2 diabetes mellitus with diabetic chronic kidney disease: Secondary | ICD-10-CM | POA: Diagnosis not present

## 2018-12-08 DIAGNOSIS — J441 Chronic obstructive pulmonary disease with (acute) exacerbation: Secondary | ICD-10-CM | POA: Diagnosis not present

## 2018-12-08 DIAGNOSIS — K1379 Other lesions of oral mucosa: Secondary | ICD-10-CM | POA: Diagnosis not present

## 2018-12-08 DIAGNOSIS — I10 Essential (primary) hypertension: Secondary | ICD-10-CM | POA: Diagnosis not present

## 2018-12-19 DIAGNOSIS — I10 Essential (primary) hypertension: Secondary | ICD-10-CM | POA: Diagnosis not present

## 2018-12-19 DIAGNOSIS — E119 Type 2 diabetes mellitus without complications: Secondary | ICD-10-CM | POA: Diagnosis not present

## 2018-12-19 DIAGNOSIS — J449 Chronic obstructive pulmonary disease, unspecified: Secondary | ICD-10-CM | POA: Diagnosis not present

## 2019-01-07 DIAGNOSIS — Z299 Encounter for prophylactic measures, unspecified: Secondary | ICD-10-CM | POA: Diagnosis not present

## 2019-01-07 DIAGNOSIS — Z981 Arthrodesis status: Secondary | ICD-10-CM | POA: Diagnosis not present

## 2019-01-07 DIAGNOSIS — E1142 Type 2 diabetes mellitus with diabetic polyneuropathy: Secondary | ICD-10-CM | POA: Diagnosis not present

## 2019-01-07 DIAGNOSIS — S7000XA Contusion of unspecified hip, initial encounter: Secondary | ICD-10-CM | POA: Diagnosis not present

## 2019-01-07 DIAGNOSIS — M25551 Pain in right hip: Secondary | ICD-10-CM | POA: Diagnosis not present

## 2019-01-07 DIAGNOSIS — E1165 Type 2 diabetes mellitus with hyperglycemia: Secondary | ICD-10-CM | POA: Diagnosis not present

## 2019-01-07 DIAGNOSIS — M545 Low back pain: Secondary | ICD-10-CM | POA: Diagnosis not present

## 2019-01-07 DIAGNOSIS — M549 Dorsalgia, unspecified: Secondary | ICD-10-CM | POA: Diagnosis not present

## 2019-01-07 DIAGNOSIS — Z6834 Body mass index (BMI) 34.0-34.9, adult: Secondary | ICD-10-CM | POA: Diagnosis not present

## 2019-01-07 DIAGNOSIS — M25561 Pain in right knee: Secondary | ICD-10-CM | POA: Diagnosis not present

## 2019-01-07 DIAGNOSIS — S79911A Unspecified injury of right hip, initial encounter: Secondary | ICD-10-CM | POA: Diagnosis not present

## 2019-01-07 DIAGNOSIS — I10 Essential (primary) hypertension: Secondary | ICD-10-CM | POA: Diagnosis not present

## 2019-01-07 DIAGNOSIS — M25461 Effusion, right knee: Secondary | ICD-10-CM | POA: Diagnosis not present

## 2019-01-07 DIAGNOSIS — M1711 Unilateral primary osteoarthritis, right knee: Secondary | ICD-10-CM | POA: Diagnosis not present

## 2019-01-14 DIAGNOSIS — Z299 Encounter for prophylactic measures, unspecified: Secondary | ICD-10-CM | POA: Diagnosis not present

## 2019-01-14 DIAGNOSIS — E1142 Type 2 diabetes mellitus with diabetic polyneuropathy: Secondary | ICD-10-CM | POA: Diagnosis not present

## 2019-01-14 DIAGNOSIS — M25551 Pain in right hip: Secondary | ICD-10-CM | POA: Diagnosis not present

## 2019-01-14 DIAGNOSIS — Z6833 Body mass index (BMI) 33.0-33.9, adult: Secondary | ICD-10-CM | POA: Diagnosis not present

## 2019-01-14 DIAGNOSIS — I10 Essential (primary) hypertension: Secondary | ICD-10-CM | POA: Diagnosis not present

## 2019-01-14 DIAGNOSIS — J449 Chronic obstructive pulmonary disease, unspecified: Secondary | ICD-10-CM | POA: Diagnosis not present

## 2019-01-14 DIAGNOSIS — E1165 Type 2 diabetes mellitus with hyperglycemia: Secondary | ICD-10-CM | POA: Diagnosis not present

## 2019-01-15 DIAGNOSIS — I10 Essential (primary) hypertension: Secondary | ICD-10-CM | POA: Diagnosis not present

## 2019-01-15 DIAGNOSIS — J449 Chronic obstructive pulmonary disease, unspecified: Secondary | ICD-10-CM | POA: Diagnosis not present

## 2019-02-06 DIAGNOSIS — J449 Chronic obstructive pulmonary disease, unspecified: Secondary | ICD-10-CM | POA: Diagnosis not present

## 2019-02-06 DIAGNOSIS — I10 Essential (primary) hypertension: Secondary | ICD-10-CM | POA: Diagnosis not present

## 2019-02-09 DIAGNOSIS — E1165 Type 2 diabetes mellitus with hyperglycemia: Secondary | ICD-10-CM | POA: Diagnosis not present

## 2019-02-18 DIAGNOSIS — E1165 Type 2 diabetes mellitus with hyperglycemia: Secondary | ICD-10-CM | POA: Diagnosis not present

## 2019-02-18 DIAGNOSIS — E1122 Type 2 diabetes mellitus with diabetic chronic kidney disease: Secondary | ICD-10-CM | POA: Diagnosis not present

## 2019-02-18 DIAGNOSIS — N183 Chronic kidney disease, stage 3 (moderate): Secondary | ICD-10-CM | POA: Diagnosis not present

## 2019-02-18 DIAGNOSIS — Z299 Encounter for prophylactic measures, unspecified: Secondary | ICD-10-CM | POA: Diagnosis not present

## 2019-02-18 DIAGNOSIS — E1142 Type 2 diabetes mellitus with diabetic polyneuropathy: Secondary | ICD-10-CM | POA: Diagnosis not present

## 2019-02-18 DIAGNOSIS — I10 Essential (primary) hypertension: Secondary | ICD-10-CM | POA: Diagnosis not present

## 2019-02-18 DIAGNOSIS — Z6833 Body mass index (BMI) 33.0-33.9, adult: Secondary | ICD-10-CM | POA: Diagnosis not present

## 2019-02-26 DIAGNOSIS — F1120 Opioid dependence, uncomplicated: Secondary | ICD-10-CM | POA: Diagnosis not present

## 2019-02-26 DIAGNOSIS — M47816 Spondylosis without myelopathy or radiculopathy, lumbar region: Secondary | ICD-10-CM | POA: Diagnosis not present

## 2019-02-26 DIAGNOSIS — J449 Chronic obstructive pulmonary disease, unspecified: Secondary | ICD-10-CM | POA: Diagnosis not present

## 2019-02-26 DIAGNOSIS — M961 Postlaminectomy syndrome, not elsewhere classified: Secondary | ICD-10-CM | POA: Diagnosis not present

## 2019-02-26 DIAGNOSIS — Z79899 Other long term (current) drug therapy: Secondary | ICD-10-CM | POA: Diagnosis not present

## 2019-02-26 DIAGNOSIS — E1142 Type 2 diabetes mellitus with diabetic polyneuropathy: Secondary | ICD-10-CM | POA: Diagnosis not present

## 2019-03-11 DIAGNOSIS — M5416 Radiculopathy, lumbar region: Secondary | ICD-10-CM | POA: Diagnosis not present

## 2019-03-17 DIAGNOSIS — J449 Chronic obstructive pulmonary disease, unspecified: Secondary | ICD-10-CM | POA: Diagnosis not present

## 2019-03-17 DIAGNOSIS — I10 Essential (primary) hypertension: Secondary | ICD-10-CM | POA: Diagnosis not present

## 2019-03-19 DIAGNOSIS — J449 Chronic obstructive pulmonary disease, unspecified: Secondary | ICD-10-CM | POA: Diagnosis not present

## 2019-03-19 DIAGNOSIS — M545 Low back pain: Secondary | ICD-10-CM | POA: Diagnosis not present

## 2019-03-19 DIAGNOSIS — E1165 Type 2 diabetes mellitus with hyperglycemia: Secondary | ICD-10-CM | POA: Diagnosis not present

## 2019-03-19 DIAGNOSIS — Z6833 Body mass index (BMI) 33.0-33.9, adult: Secondary | ICD-10-CM | POA: Diagnosis not present

## 2019-03-19 DIAGNOSIS — N183 Chronic kidney disease, stage 3 (moderate): Secondary | ICD-10-CM | POA: Diagnosis not present

## 2019-03-19 DIAGNOSIS — E1142 Type 2 diabetes mellitus with diabetic polyneuropathy: Secondary | ICD-10-CM | POA: Diagnosis not present

## 2019-03-19 DIAGNOSIS — I10 Essential (primary) hypertension: Secondary | ICD-10-CM | POA: Diagnosis not present

## 2019-03-19 DIAGNOSIS — E1122 Type 2 diabetes mellitus with diabetic chronic kidney disease: Secondary | ICD-10-CM | POA: Diagnosis not present

## 2019-03-19 DIAGNOSIS — Z299 Encounter for prophylactic measures, unspecified: Secondary | ICD-10-CM | POA: Diagnosis not present

## 2019-03-23 DIAGNOSIS — E1165 Type 2 diabetes mellitus with hyperglycemia: Secondary | ICD-10-CM | POA: Diagnosis not present

## 2019-03-24 DIAGNOSIS — I10 Essential (primary) hypertension: Secondary | ICD-10-CM | POA: Diagnosis not present

## 2019-03-24 DIAGNOSIS — Z6836 Body mass index (BMI) 36.0-36.9, adult: Secondary | ICD-10-CM | POA: Diagnosis not present

## 2019-03-24 DIAGNOSIS — M47816 Spondylosis without myelopathy or radiculopathy, lumbar region: Secondary | ICD-10-CM | POA: Diagnosis not present

## 2019-03-24 DIAGNOSIS — F1120 Opioid dependence, uncomplicated: Secondary | ICD-10-CM | POA: Diagnosis not present

## 2019-03-24 DIAGNOSIS — J449 Chronic obstructive pulmonary disease, unspecified: Secondary | ICD-10-CM | POA: Diagnosis not present

## 2019-03-24 DIAGNOSIS — M5416 Radiculopathy, lumbar region: Secondary | ICD-10-CM | POA: Diagnosis not present

## 2019-03-24 DIAGNOSIS — E1142 Type 2 diabetes mellitus with diabetic polyneuropathy: Secondary | ICD-10-CM | POA: Diagnosis not present

## 2019-03-24 DIAGNOSIS — Z79899 Other long term (current) drug therapy: Secondary | ICD-10-CM | POA: Diagnosis not present

## 2019-03-24 DIAGNOSIS — Z6835 Body mass index (BMI) 35.0-35.9, adult: Secondary | ICD-10-CM | POA: Diagnosis not present

## 2019-04-23 DIAGNOSIS — E1165 Type 2 diabetes mellitus with hyperglycemia: Secondary | ICD-10-CM | POA: Diagnosis not present

## 2019-05-02 DIAGNOSIS — J440 Chronic obstructive pulmonary disease with acute lower respiratory infection: Secondary | ICD-10-CM | POA: Diagnosis not present

## 2019-05-02 DIAGNOSIS — Z452 Encounter for adjustment and management of vascular access device: Secondary | ICD-10-CM | POA: Diagnosis not present

## 2019-05-02 DIAGNOSIS — E119 Type 2 diabetes mellitus without complications: Secondary | ICD-10-CM | POA: Diagnosis not present

## 2019-05-02 DIAGNOSIS — J111 Influenza due to unidentified influenza virus with other respiratory manifestations: Secondary | ICD-10-CM | POA: Diagnosis not present

## 2019-05-02 DIAGNOSIS — J189 Pneumonia, unspecified organism: Secondary | ICD-10-CM | POA: Diagnosis not present

## 2019-05-02 DIAGNOSIS — J9601 Acute respiratory failure with hypoxia: Secondary | ICD-10-CM | POA: Diagnosis not present

## 2019-05-02 DIAGNOSIS — U071 COVID-19: Secondary | ICD-10-CM | POA: Diagnosis not present

## 2019-05-02 DIAGNOSIS — R918 Other nonspecific abnormal finding of lung field: Secondary | ICD-10-CM | POA: Diagnosis not present

## 2019-05-02 DIAGNOSIS — I10 Essential (primary) hypertension: Secondary | ICD-10-CM | POA: Diagnosis not present

## 2019-05-02 DIAGNOSIS — J1289 Other viral pneumonia: Secondary | ICD-10-CM | POA: Diagnosis not present

## 2019-05-02 DIAGNOSIS — J441 Chronic obstructive pulmonary disease with (acute) exacerbation: Secondary | ICD-10-CM | POA: Diagnosis not present

## 2019-05-02 DIAGNOSIS — Z1159 Encounter for screening for other viral diseases: Secondary | ICD-10-CM | POA: Diagnosis not present

## 2019-05-02 DIAGNOSIS — J45909 Unspecified asthma, uncomplicated: Secondary | ICD-10-CM | POA: Diagnosis not present

## 2019-05-02 DIAGNOSIS — R0602 Shortness of breath: Secondary | ICD-10-CM | POA: Diagnosis not present

## 2019-05-02 DIAGNOSIS — Z6834 Body mass index (BMI) 34.0-34.9, adult: Secondary | ICD-10-CM | POA: Diagnosis not present

## 2019-05-15 ENCOUNTER — Inpatient Hospital Stay (HOSPITAL_COMMUNITY)
Admission: AD | Admit: 2019-05-15 | Discharge: 2019-06-26 | DRG: 207 | Disposition: E | Payer: Medicare HMO | Source: Other Acute Inpatient Hospital | Attending: Pulmonary Disease | Admitting: Pulmonary Disease

## 2019-05-15 ENCOUNTER — Encounter (HOSPITAL_COMMUNITY): Payer: Self-pay

## 2019-05-15 ENCOUNTER — Inpatient Hospital Stay (HOSPITAL_COMMUNITY): Payer: Medicare HMO

## 2019-05-15 DIAGNOSIS — R6521 Severe sepsis with septic shock: Secondary | ICD-10-CM | POA: Diagnosis not present

## 2019-05-15 DIAGNOSIS — E1165 Type 2 diabetes mellitus with hyperglycemia: Secondary | ICD-10-CM | POA: Diagnosis not present

## 2019-05-15 DIAGNOSIS — I952 Hypotension due to drugs: Secondary | ICD-10-CM | POA: Diagnosis not present

## 2019-05-15 DIAGNOSIS — Z4682 Encounter for fitting and adjustment of non-vascular catheter: Secondary | ICD-10-CM | POA: Diagnosis not present

## 2019-05-15 DIAGNOSIS — J1289 Other viral pneumonia: Secondary | ICD-10-CM | POA: Diagnosis present

## 2019-05-15 DIAGNOSIS — J9690 Respiratory failure, unspecified, unspecified whether with hypoxia or hypercapnia: Secondary | ICD-10-CM | POA: Diagnosis not present

## 2019-05-15 DIAGNOSIS — Z9911 Dependence on respirator [ventilator] status: Secondary | ICD-10-CM | POA: Diagnosis not present

## 2019-05-15 DIAGNOSIS — J189 Pneumonia, unspecified organism: Secondary | ICD-10-CM | POA: Diagnosis not present

## 2019-05-15 DIAGNOSIS — J449 Chronic obstructive pulmonary disease, unspecified: Secondary | ICD-10-CM | POA: Diagnosis present

## 2019-05-15 DIAGNOSIS — J9600 Acute respiratory failure, unspecified whether with hypoxia or hypercapnia: Secondary | ICD-10-CM | POA: Diagnosis not present

## 2019-05-15 DIAGNOSIS — J939 Pneumothorax, unspecified: Secondary | ICD-10-CM | POA: Diagnosis present

## 2019-05-15 DIAGNOSIS — D638 Anemia in other chronic diseases classified elsewhere: Secondary | ICD-10-CM | POA: Diagnosis present

## 2019-05-15 DIAGNOSIS — I10 Essential (primary) hypertension: Secondary | ICD-10-CM | POA: Diagnosis present

## 2019-05-15 DIAGNOSIS — U071 COVID-19: Principal | ICD-10-CM

## 2019-05-15 DIAGNOSIS — J1282 Pneumonia due to coronavirus disease 2019: Secondary | ICD-10-CM | POA: Diagnosis present

## 2019-05-15 DIAGNOSIS — R000 Tachycardia, unspecified: Secondary | ICD-10-CM | POA: Diagnosis not present

## 2019-05-15 DIAGNOSIS — G9341 Metabolic encephalopathy: Secondary | ICD-10-CM | POA: Diagnosis not present

## 2019-05-15 DIAGNOSIS — E875 Hyperkalemia: Secondary | ICD-10-CM | POA: Diagnosis present

## 2019-05-15 DIAGNOSIS — T502X5A Adverse effect of carbonic-anhydrase inhibitors, benzothiadiazides and other diuretics, initial encounter: Secondary | ICD-10-CM | POA: Diagnosis not present

## 2019-05-15 DIAGNOSIS — R918 Other nonspecific abnormal finding of lung field: Secondary | ICD-10-CM | POA: Diagnosis not present

## 2019-05-15 DIAGNOSIS — K219 Gastro-esophageal reflux disease without esophagitis: Secondary | ICD-10-CM | POA: Diagnosis present

## 2019-05-15 DIAGNOSIS — Z515 Encounter for palliative care: Secondary | ICD-10-CM | POA: Diagnosis not present

## 2019-05-15 DIAGNOSIS — E119 Type 2 diabetes mellitus without complications: Secondary | ICD-10-CM

## 2019-05-15 DIAGNOSIS — I480 Paroxysmal atrial fibrillation: Secondary | ICD-10-CM | POA: Diagnosis present

## 2019-05-15 DIAGNOSIS — Y95 Nosocomial condition: Secondary | ICD-10-CM | POA: Diagnosis not present

## 2019-05-15 DIAGNOSIS — A4189 Other specified sepsis: Secondary | ICD-10-CM | POA: Diagnosis not present

## 2019-05-15 DIAGNOSIS — E669 Obesity, unspecified: Secondary | ICD-10-CM | POA: Diagnosis present

## 2019-05-15 DIAGNOSIS — E0965 Drug or chemical induced diabetes mellitus with hyperglycemia: Secondary | ICD-10-CM | POA: Diagnosis not present

## 2019-05-15 DIAGNOSIS — Z6836 Body mass index (BMI) 36.0-36.9, adult: Secondary | ICD-10-CM

## 2019-05-15 DIAGNOSIS — R0902 Hypoxemia: Secondary | ICD-10-CM

## 2019-05-15 DIAGNOSIS — Z66 Do not resuscitate: Secondary | ICD-10-CM | POA: Diagnosis not present

## 2019-05-15 DIAGNOSIS — Z9049 Acquired absence of other specified parts of digestive tract: Secondary | ICD-10-CM

## 2019-05-15 DIAGNOSIS — J9601 Acute respiratory failure with hypoxia: Secondary | ICD-10-CM

## 2019-05-15 DIAGNOSIS — K922 Gastrointestinal hemorrhage, unspecified: Secondary | ICD-10-CM | POA: Diagnosis not present

## 2019-05-15 DIAGNOSIS — D649 Anemia, unspecified: Secondary | ICD-10-CM | POA: Diagnosis not present

## 2019-05-15 DIAGNOSIS — R739 Hyperglycemia, unspecified: Secondary | ICD-10-CM | POA: Diagnosis not present

## 2019-05-15 DIAGNOSIS — K921 Melena: Secondary | ICD-10-CM | POA: Diagnosis not present

## 2019-05-15 DIAGNOSIS — I361 Nonrheumatic tricuspid (valve) insufficiency: Secondary | ICD-10-CM | POA: Diagnosis not present

## 2019-05-15 DIAGNOSIS — D62 Acute posthemorrhagic anemia: Secondary | ICD-10-CM | POA: Diagnosis not present

## 2019-05-15 DIAGNOSIS — I319 Disease of pericardium, unspecified: Secondary | ICD-10-CM | POA: Diagnosis present

## 2019-05-15 DIAGNOSIS — Z888 Allergy status to other drugs, medicaments and biological substances status: Secondary | ICD-10-CM

## 2019-05-15 DIAGNOSIS — J80 Acute respiratory distress syndrome: Secondary | ICD-10-CM | POA: Diagnosis not present

## 2019-05-15 DIAGNOSIS — N170 Acute kidney failure with tubular necrosis: Secondary | ICD-10-CM | POA: Diagnosis not present

## 2019-05-15 DIAGNOSIS — J439 Emphysema, unspecified: Secondary | ICD-10-CM | POA: Diagnosis not present

## 2019-05-15 DIAGNOSIS — E1140 Type 2 diabetes mellitus with diabetic neuropathy, unspecified: Secondary | ICD-10-CM | POA: Diagnosis present

## 2019-05-15 DIAGNOSIS — E099 Drug or chemical induced diabetes mellitus without complications: Secondary | ICD-10-CM | POA: Diagnosis not present

## 2019-05-15 DIAGNOSIS — J982 Interstitial emphysema: Secondary | ICD-10-CM | POA: Diagnosis present

## 2019-05-15 DIAGNOSIS — T4275XA Adverse effect of unspecified antiepileptic and sedative-hypnotic drugs, initial encounter: Secondary | ICD-10-CM | POA: Diagnosis not present

## 2019-05-15 DIAGNOSIS — Z7189 Other specified counseling: Secondary | ICD-10-CM | POA: Diagnosis not present

## 2019-05-15 DIAGNOSIS — Z881 Allergy status to other antibiotic agents status: Secondary | ICD-10-CM

## 2019-05-15 DIAGNOSIS — J9691 Respiratory failure, unspecified with hypoxia: Secondary | ICD-10-CM | POA: Diagnosis not present

## 2019-05-15 DIAGNOSIS — E872 Acidosis: Secondary | ICD-10-CM | POA: Diagnosis present

## 2019-05-15 DIAGNOSIS — Z87891 Personal history of nicotine dependence: Secondary | ICD-10-CM

## 2019-05-15 DIAGNOSIS — J90 Pleural effusion, not elsewhere classified: Secondary | ICD-10-CM | POA: Diagnosis not present

## 2019-05-15 DIAGNOSIS — L8990 Pressure ulcer of unspecified site, unspecified stage: Secondary | ICD-10-CM | POA: Insufficient documentation

## 2019-05-15 HISTORY — DX: Polyneuropathy, unspecified: G62.9

## 2019-05-15 HISTORY — DX: Gastro-esophageal reflux disease without esophagitis: K21.9

## 2019-05-15 HISTORY — DX: Essential (primary) hypertension: I10

## 2019-05-15 HISTORY — DX: Unspecified cataract: H26.9

## 2019-05-15 HISTORY — DX: Chronic obstructive pulmonary disease, unspecified: J44.9

## 2019-05-15 HISTORY — DX: Type 2 diabetes mellitus without complications: E11.9

## 2019-05-15 HISTORY — DX: Unspecified asthma, uncomplicated: J45.909

## 2019-05-15 LAB — POCT I-STAT 7, (LYTES, BLD GAS, ICA,H+H)
Acid-Base Excess: 1 mmol/L (ref 0.0–2.0)
Acid-base deficit: 1 mmol/L (ref 0.0–2.0)
Bicarbonate: 24.9 mmol/L (ref 20.0–28.0)
Bicarbonate: 25.8 mmol/L (ref 20.0–28.0)
Bicarbonate: 28.2 mmol/L — ABNORMAL HIGH (ref 20.0–28.0)
Bicarbonate: 28.6 mmol/L — ABNORMAL HIGH (ref 20.0–28.0)
Calcium, Ion: 1.15 mmol/L (ref 1.15–1.40)
Calcium, Ion: 1.13 mmol/L — ABNORMAL LOW (ref 1.15–1.40)
Calcium, Ion: 1.11 mmol/L — ABNORMAL LOW (ref 1.15–1.40)
Calcium, Ion: 1.17 mmol/L (ref 1.15–1.40)
HCT: 35 % — ABNORMAL LOW (ref 36.0–46.0)
HCT: 37 % (ref 36.0–46.0)
HCT: 38 % (ref 36.0–46.0)
HCT: 38 % (ref 36.0–46.0)
Hemoglobin: 11.9 g/dL — ABNORMAL LOW (ref 12.0–15.0)
Hemoglobin: 12.6 g/dL (ref 12.0–15.0)
Hemoglobin: 12.9 g/dL (ref 12.0–15.0)
Hemoglobin: 12.9 g/dL (ref 12.0–15.0)
O2 Saturation: 83 %
O2 Saturation: 94 %
O2 Saturation: 99 %
O2 Saturation: 96 %
Patient temperature: 36.5
Patient temperature: 36.9
Potassium: 3.3 mmol/L — ABNORMAL LOW (ref 3.5–5.1)
Potassium: 3.5 mmol/L (ref 3.5–5.1)
Potassium: 3.7 mmol/L (ref 3.5–5.1)
Potassium: 3.6 mmol/L (ref 3.5–5.1)
Sodium: 141 mmol/L (ref 135–145)
Sodium: 140 mmol/L (ref 135–145)
Sodium: 138 mmol/L (ref 135–145)
Sodium: 140 mmol/L (ref 135–145)
TCO2: 26 mmol/L (ref 22–32)
TCO2: 27 mmol/L (ref 22–32)
TCO2: 30 mmol/L (ref 22–32)
TCO2: 30 mmol/L (ref 22–32)
pCO2 arterial: 38.4 mmHg (ref 32.0–48.0)
pCO2 arterial: 48.1 mmHg — ABNORMAL HIGH (ref 32.0–48.0)
pCO2 arterial: 58.8 mmHg — ABNORMAL HIGH (ref 32.0–48.0)
pCO2 arterial: 56.2 mmHg — ABNORMAL HIGH (ref 32.0–48.0)
pH, Arterial: 7.417 (ref 7.350–7.450)
pH, Arterial: 7.337 — ABNORMAL LOW (ref 7.350–7.450)
pH, Arterial: 7.289 — ABNORMAL LOW (ref 7.350–7.450)
pH, Arterial: 7.315 — ABNORMAL LOW (ref 7.350–7.450)
pO2, Arterial: 45 mmHg — ABNORMAL LOW (ref 83.0–108.0)
pO2, Arterial: 77 mmHg — ABNORMAL LOW (ref 83.0–108.0)
pO2, Arterial: 178 mmHg — ABNORMAL HIGH (ref 83.0–108.0)
pO2, Arterial: 95 mmHg (ref 83.0–108.0)

## 2019-05-15 LAB — CBC
HCT: 36.6 % (ref 36.0–46.0)
Hemoglobin: 11 g/dL — ABNORMAL LOW (ref 12.0–15.0)
MCH: 26.5 pg (ref 26.0–34.0)
MCHC: 30.1 g/dL (ref 30.0–36.0)
MCV: 88.2 fL (ref 80.0–100.0)
Platelets: 291 10*3/uL (ref 150–400)
RBC: 4.15 MIL/uL (ref 3.87–5.11)
RDW: 15.9 % — ABNORMAL HIGH (ref 11.5–15.5)
WBC: 14.7 10*3/uL — ABNORMAL HIGH (ref 4.0–10.5)
nRBC: 0 % (ref 0.0–0.2)

## 2019-05-15 LAB — ABO/RH: ABO/RH(D): O POS

## 2019-05-15 LAB — BRAIN NATRIURETIC PEPTIDE: B Natriuretic Peptide: 112.9 pg/mL — ABNORMAL HIGH (ref 0.0–100.0)

## 2019-05-15 LAB — COMPREHENSIVE METABOLIC PANEL
ALT: 76 U/L — ABNORMAL HIGH (ref 0–44)
AST: 65 U/L — ABNORMAL HIGH (ref 15–41)
Albumin: 2.3 g/dL — ABNORMAL LOW (ref 3.5–5.0)
Alkaline Phosphatase: 160 U/L — ABNORMAL HIGH (ref 38–126)
Anion gap: 9 (ref 5–15)
BUN: 18 mg/dL (ref 8–23)
CO2: 26 mmol/L (ref 22–32)
Calcium: 7.7 mg/dL — ABNORMAL LOW (ref 8.9–10.3)
Chloride: 105 mmol/L (ref 98–111)
Creatinine, Ser: 0.71 mg/dL (ref 0.44–1.00)
GFR calc Af Amer: >60 mL/min (ref >60)
GFR calc non Af Amer: >60 mL/min (ref >60)
Glucose, Bld: 244 mg/dL — ABNORMAL HIGH (ref 70–99)
Potassium: 4.1 mmol/L (ref 3.5–5.1)
Sodium: 140 mmol/L (ref 135–145)
Total Bilirubin: 0.8 mg/dL (ref 0.3–1.2)
Total Protein: 5.9 g/dL — ABNORMAL LOW (ref 6.5–8.1)

## 2019-05-15 LAB — CREATININE, SERUM
Creatinine, Ser: 0.63 mg/dL (ref 0.44–1.00)
GFR calc Af Amer: >60 mL/min (ref >60)
GFR calc non Af Amer: >60 mL/min (ref >60)

## 2019-05-15 LAB — HEMOGLOBIN A1C
Hgb A1c MFr Bld: 9.8 % — ABNORMAL HIGH (ref 4.8–5.6)
Mean Plasma Glucose: 234.56 mg/dL

## 2019-05-15 LAB — PROCALCITONIN: Procalcitonin: <0.1 ng/mL

## 2019-05-15 LAB — TROPONIN I (HIGH SENSITIVITY)
Troponin I (High Sensitivity): 11 ng/L (ref <18)
Troponin I (High Sensitivity): 10 ng/L (ref <18)

## 2019-05-15 LAB — GLUCOSE, CAPILLARY
Glucose-Capillary: 239 mg/dL — ABNORMAL HIGH (ref 70–99)
Glucose-Capillary: 223 mg/dL — ABNORMAL HIGH (ref 70–99)
Glucose-Capillary: 259 mg/dL — ABNORMAL HIGH (ref 70–99)
Glucose-Capillary: 122 mg/dL — ABNORMAL HIGH (ref 70–99)

## 2019-05-15 LAB — TRIGLYCERIDES: Triglycerides: 193 mg/dL — ABNORMAL HIGH (ref <150)

## 2019-05-15 LAB — FERRITIN: Ferritin: 148 ng/mL (ref 11–307)

## 2019-05-15 LAB — LACTATE DEHYDROGENASE: LDH: 328 U/L — ABNORMAL HIGH (ref 98–192)

## 2019-05-15 LAB — MRSA PCR SCREENING: MRSA by PCR: NEGATIVE

## 2019-05-15 LAB — LACTIC ACID, PLASMA: Lactic Acid, Venous: 2.1 mmol/L (ref 0.5–1.9)

## 2019-05-15 LAB — MAGNESIUM: Magnesium: 2 mg/dL (ref 1.7–2.4)

## 2019-05-15 MED ORDER — FENTANYL 2500MCG IN NS 250ML (10MCG/ML) PREMIX INFUSION
25.0000 ug/h | INTRAVENOUS | Status: DC
  Administered 2019-05-15 (×2): 100 ug/h via INTRAVENOUS
  Administered 2019-05-16: 150 ug/h via INTRAVENOUS
  Administered 2019-05-17 (×2): 175 ug/h via INTRAVENOUS
  Filled 2019-05-15 (×5): qty 250

## 2019-05-15 MED ORDER — DEXAMETHASONE SODIUM PHOSPHATE 10 MG/ML IJ SOLN
6.0000 mg | INTRAMUSCULAR | Status: AC
  Administered 2019-05-15 – 2019-05-23 (×9): 6 mg via INTRAVENOUS
  Filled 2019-05-15 (×9): qty 1

## 2019-05-15 MED ORDER — MIDAZOLAM HCL 2 MG/2ML IJ SOLN
1.0000 mg | INTRAMUSCULAR | Status: DC | PRN
  Administered 2019-05-15: 1 mg via INTRAVENOUS
  Filled 2019-05-15: qty 2

## 2019-05-15 MED ORDER — INSULIN ASPART 100 UNIT/ML ~~LOC~~ SOLN
0.0000 [IU] | SUBCUTANEOUS | Status: DC
  Administered 2019-05-15: 2 [IU] via SUBCUTANEOUS
  Administered 2019-05-15 (×2): 3 [IU] via SUBCUTANEOUS
  Administered 2019-05-15 – 2019-05-16 (×3): 2 [IU] via SUBCUTANEOUS
  Administered 2019-05-16: 12:00:00 3 [IU] via SUBCUTANEOUS
  Administered 2019-05-17: 17:00:00 2 [IU] via SUBCUTANEOUS
  Administered 2019-05-17: 1 [IU] via SUBCUTANEOUS
  Administered 2019-05-17: 2 [IU] via SUBCUTANEOUS
  Administered 2019-05-17 – 2019-05-18 (×5): 1 [IU] via SUBCUTANEOUS
  Administered 2019-05-18: 2 [IU] via SUBCUTANEOUS
  Administered 2019-05-18 (×2): 3 [IU] via SUBCUTANEOUS
  Administered 2019-05-18 – 2019-05-19 (×2): 1 [IU] via SUBCUTANEOUS
  Administered 2019-05-19: 3 [IU] via SUBCUTANEOUS
  Administered 2019-05-19: 2 [IU] via SUBCUTANEOUS

## 2019-05-15 MED ORDER — MIDAZOLAM HCL 2 MG/2ML IJ SOLN: 1.0000 mg | INTRAMUSCULAR | Status: DC | PRN

## 2019-05-15 MED ORDER — ONDANSETRON HCL 4 MG/2ML IJ SOLN: 4.0000 mg | Freq: Four times a day (QID) | INTRAMUSCULAR | Status: DC | PRN

## 2019-05-15 MED ORDER — FUROSEMIDE 10 MG/ML IJ SOLN
40.0000 mg | Freq: Once | INTRAMUSCULAR | Status: AC
  Administered 2019-05-15: 40 mg via INTRAVENOUS
  Filled 2019-05-15: qty 4

## 2019-05-15 MED ORDER — IPRATROPIUM-ALBUTEROL 20-100 MCG/ACT IN AERS: 1.0000 | INHALATION_SPRAY | Freq: Four times a day (QID) | RESPIRATORY_TRACT | Status: DC

## 2019-05-15 MED ORDER — SODIUM CHLORIDE 0.9% FLUSH
3.0000 mL | Freq: Two times a day (BID) | INTRAVENOUS | Status: DC
  Administered 2019-05-15 – 2019-05-20 (×6): 3 mL via INTRAVENOUS
  Administered 2019-05-21: 10 mL via INTRAVENOUS
  Administered 2019-05-22 – 2019-05-30 (×13): 3 mL via INTRAVENOUS

## 2019-05-15 MED ORDER — MIDAZOLAM BOLUS VIA INFUSION
1.0000 mg | INTRAVENOUS | Status: DC | PRN
  Administered 2019-05-18: 1 mg via INTRAVENOUS
  Filled 2019-05-15: qty 1

## 2019-05-15 MED ORDER — ENOXAPARIN SODIUM 40 MG/0.4ML ~~LOC~~ SOLN
40.0000 mg | Freq: Two times a day (BID) | SUBCUTANEOUS | Status: DC
  Administered 2019-05-15 – 2019-05-18 (×7): 40 mg via SUBCUTANEOUS
  Filled 2019-05-15 (×7): qty 0.4

## 2019-05-15 MED ORDER — PRO-STAT SUGAR FREE PO LIQD
30.0000 mL | Freq: Four times a day (QID) | ORAL | Status: DC
  Administered 2019-05-15 – 2019-05-19 (×15): 30 mL
  Filled 2019-05-15 (×12): qty 30

## 2019-05-15 MED ORDER — FENTANYL BOLUS VIA INFUSION
25.0000 ug | INTRAVENOUS | Status: DC | PRN
  Filled 2019-05-15: qty 25

## 2019-05-15 MED ORDER — FENTANYL CITRATE (PF) 100 MCG/2ML IJ SOLN
25.0000 ug | Freq: Once | INTRAMUSCULAR | Status: AC
  Administered 2019-05-15: 25 ug via INTRAVENOUS
  Filled 2019-05-15: qty 2

## 2019-05-15 MED ORDER — VITAL HIGH PROTEIN PO LIQD
1000.0000 mL | ORAL | Status: DC
  Administered 2019-05-15 – 2019-05-18 (×4): 1000 mL
  Filled 2019-05-15: qty 1000

## 2019-05-15 MED ORDER — GUAIFENESIN-DM 100-10 MG/5ML PO SYRP: 10.0000 mL | ORAL_SOLUTION | ORAL | Status: DC | PRN

## 2019-05-15 MED ORDER — SODIUM CHLORIDE 0.9% FLUSH
10.0000 mL | Freq: Two times a day (BID) | INTRAVENOUS | Status: DC
  Administered 2019-05-15 – 2019-05-30 (×19): 10 mL

## 2019-05-15 MED ORDER — HYDROCOD POLST-CPM POLST ER 10-8 MG/5ML PO SUER: 5.0000 mL | Freq: Two times a day (BID) | ORAL | Status: DC | PRN

## 2019-05-15 MED ORDER — NOREPINEPHRINE 4 MG/250ML-% IV SOLN
0.0000 ug/min | INTRAVENOUS | Status: DC
  Administered 2019-05-15: 2 ug/min via INTRAVENOUS
  Administered 2019-05-15: 6 ug/min via INTRAVENOUS
  Administered 2019-05-16: 3 ug/min via INTRAVENOUS
  Administered 2019-05-17: 6 ug/min via INTRAVENOUS
  Administered 2019-05-17: 7 ug/min via INTRAVENOUS
  Filled 2019-05-15 (×6): qty 250

## 2019-05-15 MED ORDER — CHLORHEXIDINE GLUCONATE 0.12% ORAL RINSE (MEDLINE KIT)
15.0000 mL | Freq: Two times a day (BID) | OROMUCOSAL | Status: DC
  Administered 2019-05-15 – 2019-05-18 (×7): 15 mL via OROMUCOSAL

## 2019-05-15 MED ORDER — ORAL CARE MOUTH RINSE
15.0000 mL | OROMUCOSAL | Status: DC
  Administered 2019-05-15 – 2019-05-30 (×154): 15 mL via OROMUCOSAL

## 2019-05-15 MED ORDER — ACETAMINOPHEN 325 MG PO TABS
650.0000 mg | ORAL_TABLET | Freq: Four times a day (QID) | ORAL | Status: DC | PRN
  Administered 2019-05-18 (×2): 650 mg via NASOGASTRIC
  Filled 2019-05-15 (×2): qty 2

## 2019-05-15 MED ORDER — ZINC SULFATE 220 (50 ZN) MG PO CAPS
220.0000 mg | ORAL_CAPSULE | Freq: Every day | ORAL | Status: DC
  Administered 2019-05-15 – 2019-05-30 (×16): 220 mg via ORAL
  Filled 2019-05-15 (×16): qty 1

## 2019-05-15 MED ORDER — SODIUM CHLORIDE 0.9 % IV SOLN
250.0000 mL | INTRAVENOUS | Status: DC | PRN
  Administered 2019-05-15 – 2019-05-27 (×4): 250 mL via INTRAVENOUS

## 2019-05-15 MED ORDER — SODIUM CHLORIDE 0.9% FLUSH: 3.0000 mL | INTRAVENOUS | Status: DC | PRN

## 2019-05-15 MED ORDER — ONDANSETRON HCL 4 MG PO TABS: 4.0000 mg | ORAL_TABLET | Freq: Four times a day (QID) | ORAL | Status: DC | PRN

## 2019-05-15 MED ORDER — MIDAZOLAM 50MG/50ML (1MG/ML) PREMIX INFUSION: 2.0000 mg/h | INTRAVENOUS | Status: DC

## 2019-05-15 MED ORDER — VECURONIUM BROMIDE 10 MG IV SOLR
10.0000 mg | INTRAVENOUS | Status: DC | PRN
  Administered 2019-05-17: 10 mg via INTRAVENOUS
  Filled 2019-05-15 (×3): qty 10

## 2019-05-15 MED ORDER — POTASSIUM CHLORIDE 10 MEQ/50ML IV SOLN
10.0000 meq | INTRAVENOUS | Status: AC
  Administered 2019-05-15 (×4): 10 meq via INTRAVENOUS
  Filled 2019-05-15 (×4): qty 50

## 2019-05-15 MED ORDER — PROPOFOL 1000 MG/100ML IV EMUL
5.0000 ug/kg/min | INTRAVENOUS | Status: DC
  Administered 2019-05-15 (×3): 50 ug/kg/min via INTRAVENOUS
  Administered 2019-05-15 (×2): 70 ug/kg/min via INTRAVENOUS
  Administered 2019-05-16: 50 ug/kg/min via INTRAVENOUS
  Administered 2019-05-16: 40 ug/kg/min via INTRAVENOUS
  Administered 2019-05-16 (×2): 50 ug/kg/min via INTRAVENOUS
  Administered 2019-05-16 – 2019-05-17 (×4): 40 ug/kg/min via INTRAVENOUS
  Administered 2019-05-17: 30 ug/kg/min via INTRAVENOUS
  Administered 2019-05-17: 40 ug/kg/min via INTRAVENOUS
  Filled 2019-05-15 (×13): qty 100
  Filled 2019-05-15: qty 200
  Filled 2019-05-15: qty 100

## 2019-05-15 MED ORDER — CHLORHEXIDINE GLUCONATE CLOTH 2 % EX PADS
6.0000 | MEDICATED_PAD | Freq: Every day | CUTANEOUS | Status: DC
  Administered 2019-05-15 – 2019-05-30 (×16): 6 via TOPICAL

## 2019-05-15 MED ORDER — VITAMIN C 500 MG PO TABS
500.0000 mg | ORAL_TABLET | Freq: Every day | ORAL | Status: DC
  Administered 2019-05-15 – 2019-05-30 (×16): 500 mg via ORAL
  Filled 2019-05-15 (×16): qty 1

## 2019-05-15 MED ORDER — MIDAZOLAM 50MG/50ML (1MG/ML) PREMIX INFUSION
0.0000 mg/h | INTRAVENOUS | Status: DC
  Administered 2019-05-15: 2 mg/h via INTRAVENOUS
  Administered 2019-05-16 – 2019-05-17 (×3): 4 mg/h via INTRAVENOUS
  Administered 2019-05-18: 2.5 mg/h via INTRAVENOUS
  Filled 2019-05-15 (×5): qty 50

## 2019-05-15 MED ORDER — SODIUM CHLORIDE 0.9% FLUSH: 10.0000 mL | INTRAVENOUS | Status: DC | PRN

## 2019-05-15 MED ORDER — FAMOTIDINE 40 MG/5ML PO SUSR
20.0000 mg | Freq: Every day | ORAL | Status: DC
  Administered 2019-05-15 – 2019-05-21 (×7): 20 mg
  Filled 2019-05-15 (×7): qty 2.5

## 2019-05-15 MED ORDER — INSULIN DETEMIR 100 UNIT/ML ~~LOC~~ SOLN
10.0000 [IU] | Freq: Two times a day (BID) | SUBCUTANEOUS | Status: DC
  Administered 2019-05-15 – 2019-05-20 (×11): 10 [IU] via SUBCUTANEOUS
  Filled 2019-05-15 (×12): qty 0.1

## 2019-05-15 NOTE — Consult Note (Addendum)
NAME:  Teresa Barker, MRN:  [id number], DOB:  [birth year], LOS: 0 ADMISSION DATE:  05/17/2019, CONSULTATION DATE: 05/12/2019 REFERRING MD: Derrill Kay, MD, CHIEF COMPLAINT: COVID-19 pneumonia  Brief History   68 year old with COPD, diabetes, obesity, asthma Admitted to [facility] around 11/10 with COVID-19 pneumonia.  Initially on heated high flow with progressive respiratory failure needing BiPAP for the past 4 days. Intubated for progressive respiratory failure and transferred to [facility].  Past Medical History  COPD, diabetes, obesity, asthma, GERD, hypertension, neuropathy  Significant Hospital Events   11/8- Admit to [facility] 11/19- Intubated, transfer to [facility]  Consults:    Procedures:  Femoral triple-lumen 11/19 outside hospital >   Significant Diagnostic Tests:  Chest x-ray 11/20-diffuse airspace opacities.  I have reviewed the images personally.  Micro Data:  SARS COV2 11/7 at [place]  Antimicrobials:  Completed remdesivir at [facility]  Interim history/subjective:    Objective   Blood pressure 106/81, pulse 79, temperature 97.7 F (36.5 C), temperature source Oral, resp. rate 15, height 4\' 11"  (1.499 m), weight 78.8 kg, SpO2 99 %.    Vent Mode: PRVC FiO2 (%):  [90 %-100 %] 100 % Set Rate:  [15 bmp] 15 bmp Vt Set:  [340 mL] 340 mL PEEP:  [8 cmH20-12 cmH20] 12 cmH20 Plateau Pressure:  [29 cmH20] 29 cmH20   Intake/Output Summary (Last 24 hours) at 05/23/2019 [id number] Last data filed at 05/06/2019 [id number] Gross per 24 hour  Intake 161.87 ml  Output 175 ml  Net -13.13 ml   Filed Weights   05/14/2019 0400  Weight: 78.8 kg    Examination: Gen:      No acute distress HEENT:  EOMI, sclera anicteric Neck:     No masses; no thyromegaly, ETT Lungs:    Clear to auscultation bilaterally; normal respiratory effort CV:         Regular rate and rhythm; no murmurs Abd:      + bowel sounds; soft, non-tender; no palpable masses, no  distension Ext:    No edema; adequate peripheral perfusion Skin:      Warm and dry; no rash Neuro: Sedated, unresponsive  Resolved Hospital Problem list     Assessment & Plan:  Severe ARDS secondary to COVID-19 pneumonia COPD ABG reviewed. We will switch to as ARDSnet ventilation, 6 cc/kg tidal volume. Recheck ABG and consider proning protocol based on P/F ratio Lasix 40 mg x 1 Continue Decadron Continue nebs as needed Patient has history of unspecified lung surgery.  Will need to clarify with daughter  Hyperglycemia Continue SSI coverage Start Levemir twice daily  HTN, ? Afib Check EKG Holding outpatient losartan, cardizem  Best practice:  Diet: Start tube feeds Pain/Anxiety/Delirium protocol (if indicated): Propofol VAP protocol (if indicated): NA DVT prophylaxis: Lovenox GI prophylaxis: Start pepcid Glucose control: SSI, levemir Mobility: Bed Code Status: Full Family Communication: Called both listed daughters but got no answer. Will try again later Disposition: ICU  Labs   CBC: Recent Labs  Lab 05/04/2019 0348 05/11/2019 0520  WBC  --  14.7*  HGB 11.9* 11.0*  HCT 35.0* 36.6  MCV  --  88.2  PLT  --  [id number]    Basic Metabolic Panel: Recent Labs  Lab 05/01/2019 0348 05/07/2019 0520  NA 141  --   K 3.3*  --   CREATININE  --  0.63  MG  --  2.0   GFR: Estimated Creatinine Clearance: 62.7 mL/min (by C-G formula based on SCr of 0.63  mg/dL). Recent Labs  Lab 04/26/2019 0520  PROCALCITON <0.10  WBC 14.7*    Liver Function Tests: No results for input(s): AST, ALT, ALKPHOS, BILITOT, PROT, ALBUMIN in the last 168 hours. No results for input(s): LIPASE, AMYLASE in the last 168 hours. No results for input(s): AMMONIA in the last 168 hours.  ABG    Component Value Date/Time   PHART 7.417 05/01/2019 0348   PCO2ART 38.4 05/08/2019 0348   PO2ART 45.0 (L) 05/16/2019 0348   HCO3 24.9 05/03/2019 0348   TCO2 26 04/29/2019 0348   O2SAT 83.0 05/08/2019 0348      Coagulation Profile: No results for input(s): INR, PROTIME in the last 168 hours.  Cardiac Enzymes: No results for input(s): CKTOTAL, CKMB, CKMBINDEX, TROPONINI in the last 168 hours.  HbA1C: No results found for: HGBA1C  CBG: Recent Labs  Lab 05/11/2019 0351  GLUCAP 239*    Review of Systems:   Unable to obtain as patient is intubated.  Past Medical History  She,  has a past medical history of Asthma, Cataract, COPD (chronic obstructive pulmonary disease) ([place]), DM (diabetes mellitus), type 2 ([place]), GERD (gastroesophageal reflux disease), HTN (hypertension), and Neuropathy.   Surgical History    Past Surgical History:  Procedure Laterality Date  . BACK SURGERY    . CHOLECYSTECTOMY    . LUNG SURGERY       Social History   reports that she quit smoking about 22 years ago. Her smoking use included cigarettes. She has a 15.00 pack-year smoking history. She does not have any smokeless tobacco history on file. She reports that she does not drink alcohol.   Family History   Her family history is not on file.   Allergies Allergies  Allergen Reactions  . Cefuroxime Rash  . Sulfamethoxazole-Trimethoprim Itching  . Aloe     itch  . Cephalosporins Rash     Home Medications  Prior to Admission medications   Not on File     Critical care time: 77    The patient is critically ill with multiple organ system failure and requires high complexity decision making for assessment and support, frequent evaluation and titration of therapies, advanced monitoring, review of radiographic studies and interpretation of complex data.   Critical Care Time devoted to patient care services, exclusive of separately billable procedures, described in this note is 35 minutes.   Marshell Garfinkel MD [place] [facility] Pager [phone] If no answer call [phone] 05/13/2019, 10:48 AM

## 2019-05-15 NOTE — Progress Notes (Signed)
Called pt's daughter Adrian Prince (number listed under contacts) to update her that pt has arrived to [facility] as well as a general update on pt's condition. Gave her our unit number and told her our team generally tries to call and update family at least once a day during the day, and that she can call for updates any time. She says she is anxious about her mother's health, but she is grateful for our care.

## 2019-05-15 NOTE — Progress Notes (Signed)
Pt's daughter [name] and husband called, I updated them on pt's condition and plan of care. Explained that pt is requiring a lot of ventilator support to get the oxygen her body needs, and that she is on continuous IV medication to keep her comfortable and asleep during this time.   MD called twice during the day but was unable to get in touch with the daughter then, as she works third shift and wasn't available. Got a second phone number from the daughter in case she isn't available with the primary number when the MD calls.Number is listed under contacts.  They would appreciate an MD update tomorrow. Will pass this on the day team.

## 2019-05-15 NOTE — Progress Notes (Signed)
[facility] notified need for KCL replacement, as K is 3.3.

## 2019-05-15 NOTE — Progress Notes (Signed)
Followed up with [facility] for KCL replacement orders.

## 2019-05-15 NOTE — Progress Notes (Signed)
Pt arrived to [facility] 206-3 from [facility] via [facility]. Has Propofol running at 39mcg/kg/min and fluids running at 200cc/hr, now changed back to 75cc/hr. RT at bedside drawing ABG, to adjust vent settings based on results. [facility] and [facility] notified of arrival and need for admission orders.

## 2019-05-15 NOTE — Progress Notes (Signed)
eLink Physician-Brief Progress Note Patient Name: Teresa Barker DOB: [birth year] MRN: [id number]   Date of Service  05/05/2019  HPI/Events of Note  New admission in transfer from OSH intubated and ventilated d/t Dx of COVID. ABG already drawn on current ventilator settings.   eICU Interventions  Will order: 1. Propofol IV infusion. Titrate to RASS = 0 to -1. 2. Portable CXR now.  3. Ventilator settings: 100$/PRVC 15/TV 340/P 12.        [name] [place] 05/12/2019, 4:05 AM

## 2019-05-15 NOTE — Progress Notes (Signed)
Initial Nutrition Assessment  DOCUMENTATION CODES:   Obesity unspecified  INTERVENTION:   Begin TF via OG tube:   Vital High Protein at 20 ml/h (480 ml per day)   Pro-stat 30 ml QID   Provides 480 kcal (1354 kcal total with kcal from Propofol), 102 gm protein, 401 ml free water daily  NUTRITION DIAGNOSIS:   Increased nutrient needs related to acute illness(COVID PNA) as evidenced by estimated needs.  GOAL:   Patient will meet greater than or equal to 90% of their needs  MONITOR:   TF tolerance, Vent status, Labs, Skin  REASON FOR ASSESSMENT:   Ventilator    ASSESSMENT:   68 yo female admitted with COVID 19 PNA. PMH includes COPD, DM, obesity, asthma, GERD, HTN, neuropathy.   Patient is currently intubated on ventilator support MV: 5.3 L/min Temp (24hrs), Avg:97.7 F (36.5 C), Min:97.7 F (36.5 C), Max:97.7 F (36.5 C)  Propofol: 33.1 ml/hr providing 874 kcal from lipid  OG tube in place. Okay for RD to order TF per secure chat with MD.   Labs reviewed. Triglycerides 193 (H) CBG's: 223-259  Medications reviewed and include decadron, novolog, levemir, vitamin C, zinc sulfate, levophed, propofol.   No recent weights available for review.  NUTRITION - FOCUSED PHYSICAL EXAM:  deferred  Diet Order:   Diet Order            Diet NPO time specified  Diet effective now              EDUCATION NEEDS:   Not appropriate for education at this time  Skin:  Skin Assessment: Reviewed RN Assessment  Last BM:  11/20  Height:   Ht Readings from Last 1 Encounters:  05/09/2019 4\' 11"  (1.499 m)    Weight:   Wt Readings from Last 1 Encounters:  04/26/2019 78.8 kg    Ideal Body Weight:  44.7 kg  BMI:  Body mass index is 35.09 kg/m.  Estimated Nutritional Needs:   Kcal:  1320  Protein:  >/= 89 gm  Fluid:  >/= 1.5 L    Molli Barrows, RD, LDN, [place] Pager [phone] After Hours Pager [phone]

## 2019-05-15 NOTE — Progress Notes (Signed)
Lavaged pt with 10 cc saline flush per MD order. Small amount of clear/white thin secretions suctioned out. Pt stable throughout with no complications. VS within normal limits.

## 2019-05-15 NOTE — H&P (Signed)
History and Physical    Teresa Barker [id number] DOB: May 20, 1951 DOA: 05/07/2019  PCP: Patient, No Pcp Per  Patient coming from: [facility]  Chief Complaint:  resp failure  HPI: Teresa Barker is a 68 y.o. female with medical history significant of COPD, diabetes, obesity hospitalized at [facility] for the last 10 days has been on heated high flow nasal cannula and then for the last 4 days progressed to needing BiPAP and progressively worsening had to be intubated and transferred here.  Patient is currently intubated and sedated and cannot provide any history due to her sedation.  Chest x-ray showed worsening bilateral opacities D-dimer 1.3 she has been on Lovenox 40 mg subcu twice a day CRP is 9.1 creatinine 0.5 white blood cell count 15.7 due to recent steroids.  Patient currently has stable blood pressure I have consulted critical care who is helping manage patient.  She is completed remdesivir.  Patient is being transferred here for critical care help.  Review of Systems: As per HPI otherwise 10 point review of systems negative.   Past Medical History:  Diagnosis Date  . Asthma   . Cataract   . COPD (chronic obstructive pulmonary disease) ([place])   . DM (diabetes mellitus), type 2 ([place])   . GERD (gastroesophageal reflux disease)   . HTN (hypertension)   . Neuropathy     COPD diabetes obesity     reports that she quit smoking about 22 years ago. Her smoking use included cigarettes. She has a 15.00 pack-year smoking history. She does not have any smokeless tobacco history on file. She reports that she does not drink alcohol. No history on file for drug.  Unknown  Allergies  Allergen Reactions  . Cefuroxime Rash  . Sulfamethoxazole-Trimethoprim Itching  . Aloe     itch  . Cephalosporins Rash    No family history on file.  Unknown secondary to sedation  Prior to Admission medications   Not on File    Physical Exam: Vitals:   05/22/2019 0415 05/05/2019 0430 04/26/2019  0445 04/28/2019 0500  BP: 133/79 121/88 114/83 118/86  Pulse: 75 88 78 72  Resp: 19 (!) 24 (!) 23 19  Temp:      TempSrc:      SpO2: 96% 97% 95% 96%  Weight:      Height:          Constitutional: NAD, calm, comfortable Vitals:   04/29/2019 0415 05/04/2019 0430 05/19/2019 0445 05/14/2019 0500  BP: 133/79 121/88 114/83 118/86  Pulse: 75 88 78 72  Resp: 19 (!) 24 (!) 23 19  Temp:      TempSrc:      SpO2: 96% 97% 95% 96%  Weight:      Height:       Eyes: PERRL, lids and conjunctivae normal ENMT: Mucous membranes are moist. Posterior pharynx clear of any exudate or lesions.Normal dentition.  Neck: normal, supple, no masses, no thyromegaly Respiratory: clear to auscultation bilaterally, no wheezing, no crackles. Normal respiratory effort. No accessory muscle use.  Cardiovascular: Regular rate and rhythm, no murmurs / rubs / gallops. No extremity edema. 2+ pedal pulses. No carotid bruits.  Abdomen: no tenderness, no masses palpated. No hepatosplenomegaly. Bowel sounds positive.  Musculoskeletal: no clubbing / cyanosis. No joint deformity upper and lower extremities. Good ROM, no contractures. Normal muscle tone.  Skin: no rashes, lesions, ulcers. No induration Neurologic: CN 2-12 grossly intact.  Sedated and intubated Psychiatric: Not normal judgment and insight.  Sedated  and intubated   Labs on Admission: I have personally reviewed following labs and imaging studies  CBC: Recent Labs  Lab 05/06/2019 0348  HGB 11.9*  HCT [id number]*   Basic Metabolic Panel: Recent Labs  Lab 04/27/2019 0348  NA 141  K 3.3*   GFR: CrCl cannot be calculated (No successful lab value found.). Liver Function Tests: No results for input(s): AST, ALT, ALKPHOS, BILITOT, PROT, ALBUMIN in the last 168 hours. No results for input(s): LIPASE, AMYLASE in the last 168 hours. No results for input(s): AMMONIA in the last 168 hours. Coagulation Profile: No results for input(s): INR, PROTIME in the last 168  hours. Cardiac Enzymes: No results for input(s): CKTOTAL, CKMB, CKMBINDEX, TROPONINI in the last 168 hours. BNP (last 3 results) No results for input(s): PROBNP in the last 8760 hours. HbA1C: No results for input(s): HGBA1C in the last 72 hours. CBG: Recent Labs  Lab 05/23/2019 0351  GLUCAP 239*   Lipid Profile: No results for input(s): CHOL, HDL, LDLCALC, TRIG, CHOLHDL, LDLDIRECT in the last 72 hours. Thyroid Function Tests: No results for input(s): TSH, T4TOTAL, FREET4, T3FREE, THYROIDAB in the last 72 hours. Anemia Panel: No results for input(s): VITAMINB12, FOLATE, FERRITIN, TIBC, IRON, RETICCTPCT in the last 72 hours. Urine analysis: No results found for: COLORURINE, APPEARANCEUR, LABSPEC, PHURINE, GLUCOSEU, HGBUR, BILIRUBINUR, KETONESUR, PROTEINUR, UROBILINOGEN, NITRITE, LEUKOCYTESUR Sepsis Labs: !!!!!!!!!!!!!!!!!!!!!!!!!!!!!!!!!!!!!!!!!!!! @LABRCNTIP (procalcitonin:4,lacticidven:4) )No results found for this or any previous visit (from the past 240 hour(s)).   Radiological Exams on Admission: No results found.  Chart reviewed from [facility]  Assessment/Plan 68 year old female with acute respiratory failure hypoxia secondary to Covid pneumonia Principal Problem:   Acute respiratory failure due to COVID-19 (HCC)-patient completed remdesivir.  Continue Decadron.  Continue ventilatory support.  Critical care aware of patient and involved.  Wean oxygen as tolerates.  Active Problems:   Pneumonia due to COVID-19 virus-also on Lovenox 40 mg subcu twice a day along with vitamin C and zinc.  Daily inflammatory markers will be monitored.  They have been ordered.   COPD (chronic obstructive pulmonary disease) (HCC)-lungs clear at this time placed on Combivent   DM (diabetes mellitus) (HCC)-sliding scale insulin   Obesity-stable   CRITICAL CARE Performed by: Derrill Kay A   Total critical care time: 45 minutes  Critical care time was exclusive of separately billable procedures and  treating other patients.  Critical care was necessary to treat or prevent imminent or life-threatening deterioration.  Critical care was time spent personally by me on the following activities: development of treatment plan with patient and/or surrogate as well as nursing, discussions with consultants, evaluation of patient's response to treatment, examination of patient, obtaining history from patient or surrogate, ordering and performing treatments and interventions, ordering and review of laboratory studies, ordering and review of radiographic studies, pulse oximetry and re-evaluation of patient's condition.   DVT prophylaxis: Lovenox Code Status: Full Family Communication: None Disposition Plan: Weeks Consults called: PCCM Admission status: Admission  Cc time 45 min  Gennette Shadix A MD [facility]  If 7PM-7AM, please contact night-coverage [email or web address] Password [facility]  05/19/2019, 6:13 AM

## 2019-05-16 ENCOUNTER — Inpatient Hospital Stay: Payer: Self-pay

## 2019-05-16 ENCOUNTER — Inpatient Hospital Stay (HOSPITAL_COMMUNITY): Payer: Medicare HMO

## 2019-05-16 DIAGNOSIS — J9600 Acute respiratory failure, unspecified whether with hypoxia or hypercapnia: Secondary | ICD-10-CM | POA: Diagnosis not present

## 2019-05-16 DIAGNOSIS — U071 COVID-19: Secondary | ICD-10-CM | POA: Diagnosis not present

## 2019-05-16 LAB — COMPREHENSIVE METABOLIC PANEL
ALT: 91 U/L — ABNORMAL HIGH (ref 0–44)
AST: 68 U/L — ABNORMAL HIGH (ref 15–41)
Albumin: 2.2 g/dL — ABNORMAL LOW (ref 3.5–5.0)
Alkaline Phosphatase: 138 U/L — ABNORMAL HIGH (ref 38–126)
Anion gap: 8 (ref 5–15)
BUN: 21 mg/dL (ref 8–23)
CO2: 27 mmol/L (ref 22–32)
Calcium: 8 mg/dL — ABNORMAL LOW (ref 8.9–10.3)
Chloride: 107 mmol/L (ref 98–111)
Creatinine, Ser: 0.72 mg/dL (ref 0.44–1.00)
GFR calc Af Amer: >60 mL/min (ref >60)
GFR calc non Af Amer: >60 mL/min (ref >60)
Glucose, Bld: 114 mg/dL — ABNORMAL HIGH (ref 70–99)
Potassium: 3.2 mmol/L — ABNORMAL LOW (ref 3.5–5.1)
Sodium: 142 mmol/L (ref 135–145)
Total Bilirubin: 1.2 mg/dL (ref 0.3–1.2)
Total Protein: 5.3 g/dL — ABNORMAL LOW (ref 6.5–8.1)

## 2019-05-16 LAB — GLUCOSE, CAPILLARY
Glucose-Capillary: 102 mg/dL — ABNORMAL HIGH (ref 70–99)
Glucose-Capillary: 122 mg/dL — ABNORMAL HIGH (ref 70–99)
Glucose-Capillary: 126 mg/dL — ABNORMAL HIGH (ref 70–99)
Glucose-Capillary: 204 mg/dL — ABNORMAL HIGH (ref 70–99)
Glucose-Capillary: 233 mg/dL — ABNORMAL HIGH (ref 70–99)
Glucose-Capillary: 251 mg/dL — ABNORMAL HIGH (ref 70–99)

## 2019-05-16 LAB — CBC WITH DIFFERENTIAL/PLATELET
Abs Immature Granulocytes: 0.58 10*3/uL — ABNORMAL HIGH (ref 0.00–0.07)
Basophils Absolute: 0.1 10*3/uL (ref 0.0–0.1)
Basophils Relative: 0 %
Eosinophils Absolute: 0 10*3/uL (ref 0.0–0.5)
Eosinophils Relative: 0 %
HCT: 37.8 % (ref 36.0–46.0)
Hemoglobin: 10.9 g/dL — ABNORMAL LOW (ref 12.0–15.0)
Immature Granulocytes: 2 %
Lymphocytes Relative: 4 %
Lymphs Abs: 1.2 10*3/uL (ref 0.7–4.0)
MCH: 26 pg (ref 26.0–34.0)
MCHC: 28.8 g/dL — ABNORMAL LOW (ref 30.0–36.0)
MCV: 90 fL (ref 80.0–100.0)
Monocytes Absolute: 2 10*3/uL — ABNORMAL HIGH (ref 0.1–1.0)
Monocytes Relative: 7 %
Neutro Abs: 24.1 10*3/uL — ABNORMAL HIGH (ref 1.7–7.7)
Neutrophils Relative %: 87 %
Platelets: 326 10*3/uL (ref 150–400)
RBC: 4.2 MIL/uL (ref 3.87–5.11)
RDW: 15.9 % — ABNORMAL HIGH (ref 11.5–15.5)
WBC: 27.9 10*3/uL — ABNORMAL HIGH (ref 4.0–10.5)
nRBC: 0 % (ref 0.0–0.2)

## 2019-05-16 LAB — POCT I-STAT 7, (LYTES, BLD GAS, ICA,H+H)
Acid-Base Excess: 2 mmol/L (ref 0.0–2.0)
Acid-Base Excess: 5 mmol/L — ABNORMAL HIGH (ref 0.0–2.0)
Bicarbonate: 28.3 mmol/L — ABNORMAL HIGH (ref 20.0–28.0)
Bicarbonate: 32 mmol/L — ABNORMAL HIGH (ref 20.0–28.0)
Calcium, Ion: 1.22 mmol/L (ref 1.15–1.40)
Calcium, Ion: 1.19 mmol/L (ref 1.15–1.40)
HCT: 35 % — ABNORMAL LOW (ref 36.0–46.0)
HCT: 36 % (ref 36.0–46.0)
Hemoglobin: 11.9 g/dL — ABNORMAL LOW (ref 12.0–15.0)
Hemoglobin: 12.2 g/dL (ref 12.0–15.0)
O2 Saturation: 98 %
O2 Saturation: 94 %
Patient temperature: 36.7
Potassium: 3.2 mmol/L — ABNORMAL LOW (ref 3.5–5.1)
Potassium: 4 mmol/L (ref 3.5–5.1)
Sodium: 142 mmol/L (ref 135–145)
Sodium: 141 mmol/L (ref 135–145)
TCO2: 30 mmol/L (ref 22–32)
TCO2: 34 mmol/L — ABNORMAL HIGH (ref 22–32)
pCO2 arterial: 52 mmHg — ABNORMAL HIGH (ref 32.0–48.0)
pCO2 arterial: 55.6 mmHg — ABNORMAL HIGH (ref 32.0–48.0)
pH, Arterial: 7.342 — ABNORMAL LOW (ref 7.350–7.450)
pH, Arterial: 7.368 (ref 7.350–7.450)
pO2, Arterial: 112 mmHg — ABNORMAL HIGH (ref 83.0–108.0)
pO2, Arterial: 77 mmHg — ABNORMAL LOW (ref 83.0–108.0)

## 2019-05-16 LAB — D-DIMER, QUANTITATIVE: D-Dimer, Quant: 0.92 ug/mL-FEU — ABNORMAL HIGH (ref 0.00–0.50)

## 2019-05-16 LAB — MAGNESIUM: Magnesium: 2.3 mg/dL (ref 1.7–2.4)

## 2019-05-16 LAB — C-REACTIVE PROTEIN: CRP: 2.7 mg/dL — ABNORMAL HIGH (ref <1.0)

## 2019-05-16 LAB — PHOSPHORUS: Phosphorus: 3.1 mg/dL (ref 2.5–4.6)

## 2019-05-16 MED ORDER — FUROSEMIDE 10 MG/ML IJ SOLN
40.0000 mg | Freq: Once | INTRAMUSCULAR | Status: AC
  Administered 2019-05-16: 40 mg via INTRAVENOUS
  Filled 2019-05-16: qty 4

## 2019-05-16 MED ORDER — SODIUM CHLORIDE 0.9% FLUSH: 10.0000 mL | INTRAVENOUS | Status: DC | PRN

## 2019-05-16 MED ORDER — POTASSIUM CHLORIDE 20 MEQ/15ML (10%) PO SOLN
40.0000 meq | Freq: Two times a day (BID) | ORAL | Status: DC
  Administered 2019-05-16 – 2019-05-18 (×5): 40 meq
  Filled 2019-05-16 (×4): qty 30

## 2019-05-16 MED ORDER — SODIUM CHLORIDE 0.9% FLUSH
10.0000 mL | Freq: Two times a day (BID) | INTRAVENOUS | Status: DC
  Administered 2019-05-17 (×2): 10 mL

## 2019-05-16 NOTE — Progress Notes (Signed)
Lavaged pt with 10 cc saline flush per MD order. Small amount of clear/white thin secretions suctioned out. Pt stable throughout with no complications. VS within normal limits.

## 2019-05-16 NOTE — Progress Notes (Signed)
NAME:  Teresa Barker, MRN:  [id number], DOB:  Dec 29, 1950, LOS: 1 ADMISSION DATE:  05/16/2019, CONSULTATION DATE: 05/22/2019 REFERRING MD: Derrill Kay, MD, CHIEF COMPLAINT: COVID-19 pneumonia  Brief History   68 year old with COPD, diabetes, obesity, asthma Admitted to [facility] around 11/10 with COVID-19 pneumonia.  Initially on heated high flow with progressive respiratory failure needing BiPAP for the past 4 days. Intubated for progressive respiratory failure and transferred to [facility].  Past Medical History  COPD, diabetes, obesity, asthma, GERD, hypertension, neuropathy  Significant Hospital Events   11/8- Admit to [facility] 11/19- Intubated, transfer to [facility] 11/21- New onset afib on admission > converted to NSR  Consults:    Procedures:  Femoral triple-lumen 11/19 outside hospital >   Significant Diagnostic Tests:  Chest x-ray 11/20-diffuse airspace opacities.  I have reviewed the images personally.  Micro Data:  SARS COV2 11/7 at [place]  Antimicrobials:  Completed remdesivir at [facility]  Interim history/subjective:  Remains intubated, sedated High plateau pressures  Objective   Blood pressure 114/63, pulse (!) 48, temperature 97.7 F (36.5 C), temperature source Oral, resp. rate (!) 35, height 4\' 11"  (1.499 m), weight 74.6 kg, SpO2 98 %.    Vent Mode: PRVC FiO2 (%):  [60 %-100 %] 60 % Set Rate:  [15 bmp-35 bmp] 35 bmp Vt Set:  [260 mL-340 mL] 260 mL PEEP:  [12 cmH20-16 cmH20] 14 cmH20 Plateau Pressure:  [29 cmH20-47 cmH20] 37 cmH20   Intake/Output Summary (Last 24 hours) at 05/16/2019 0815 Last data filed at 05/16/2019 0800 Gross per 24 hour  Intake 1817.34 ml  Output 2195 ml  Net -377.66 ml   Filed Weights   05/09/2019 0400 05/16/19 0500  Weight: 78.8 kg 74.6 kg    Examination: Gen:      No acute distress HEENT:  EOMI, sclera anicteric Neck:     No masses; no thyromegaly, ETT Lungs:    Clear to auscultation  bilaterally; normal respiratory effort CV:         Regular rate and rhythm; no murmurs Abd:      + bowel sounds; soft, non-tender; no palpable masses, no distension Ext:    No edema; adequate peripheral perfusion Skin:      Warm and dry; no rash Neuro: Sedated, [place] Problem list     Assessment & Plan:  Severe ARDS secondary to COVID-19 pneumonia COPD ARDSnet ventilation Will attempt to reduce TV to 5cc/kg as plt p is still high Increase PEEP to 16 to reduce driving pressure We are holding off proning as P/F ratio is adequate Repeat lasix Continue Decadron Continue nebs as needed Patient has history of unspecified lung surgery.  Will need to clarify with daughter  Hyperglycemia Continue SSI coverage Levemir  HTN, New onset A fib > now in NSR Holding outpatient losartan, cardizem  Best practice:  Diet: Start tube feeds Pain/Anxiety/Delirium protocol (if indicated): Propofol VAP protocol (if indicated): NA DVT prophylaxis: Lovenox GI prophylaxis: Start pepcid Glucose control: SSI, levemir Mobility: Bed Code Status: Full Family Communication: Daughter [name] called and updated 11/21 Disposition: ICU  Labs   CBC: Recent Labs  Lab 05/22/2019 0520 05/09/2019 1149 05/23/2019 1447 05/13/2019 1725 05/16/19 0400 05/16/19 0413  WBC 14.7*  --   --   --  27.9*  --   NEUTROABS  --   --   --   --  24.1*  --   HGB 11.0* 12.6 12.9 12.9 10.9* 11.9*  HCT 36.6 37.0  38.0 38.0 37.8 35.0*  MCV 88.2  --   --   --  90.0  --   PLT 291  --   --   --  326  --     Basic Metabolic Panel: Recent Labs  Lab 05/25/2019 0520  05/18/2019 1347 04/26/2019 1447 05/17/2019 1725 05/16/19 0400 05/16/19 0413  NA  --    < > 140 138 140 142 142  K  --    < > 4.1 3.7 3.6 3.2* 3.2*  CL  --   --  105  --   --  107  --   CO2  --   --  26  --   --  27  --   GLUCOSE  --   --  244*  --   --  114*  --   BUN  --   --  18  --   --  21  --   CREATININE 0.63  --  0.71  --   --  0.72  --    CALCIUM  --   --  7.7*  --   --  8.0*  --   MG 2.0  --   --   --   --  2.3  --   PHOS  --   --   --   --   --  3.1  --    < > = values in this interval not displayed.   GFR: Estimated Creatinine Clearance: 60.9 mL/min (by C-G formula based on SCr of 0.72 mg/dL). Recent Labs  Lab 04/28/2019 0520 05/06/2019 1348 05/16/19 0400  PROCALCITON <0.10  --   --   WBC 14.7*  --  27.9*  LATICACIDVEN  --  2.1*  --     Liver Function Tests: Recent Labs  Lab 05/04/2019 1347 05/16/19 0400  AST 65* 68*  ALT 76* 91*  ALKPHOS 160* 138*  BILITOT 0.8 1.2  PROT 5.9* 5.3*  ALBUMIN 2.3* 2.2*   No results for input(s): LIPASE, AMYLASE in the last 168 hours. No results for input(s): AMMONIA in the last 168 hours.  ABG    Component Value Date/Time   PHART 7.342 (L) 05/16/2019 0413   PCO2ART 52.0 (H) 05/16/2019 0413   PO2ART 112.0 (H) 05/16/2019 0413   HCO3 28.3 (H) 05/16/2019 0413   TCO2 30 05/16/2019 0413   ACIDBASEDEF 1.0 05/14/2019 1149   O2SAT 98.0 05/16/2019 0413     Coagulation Profile: No results for input(s): INR, PROTIME in the last 168 hours.  Cardiac Enzymes: No results for input(s): CKTOTAL, CKMB, CKMBINDEX, TROPONINI in the last 168 hours.  HbA1C: Hgb A1c MFr Bld  Date/Time Value Ref Range Status  05/22/2019 05:20 AM 9.8 (H) 4.8 - 5.6 % Final    Comment:    (NOTE) Pre diabetes:          5.7%-6.4% Diabetes:              >6.4% Glycemic control for   <7.0% adults with diabetes     CBG: Recent Labs  Lab 05/04/2019 1309 05/03/2019 2051 05/16/19 0025 05/16/19 0402 05/16/19 0753  GLUCAP 259* 122* 122* 102* 126*    Critical care time: 58   The patient is critically ill with multiple organ system failure and requires high complexity decision making for assessment and support, frequent evaluation and titration of therapies, advanced monitoring, review of radiographic studies and interpretation of complex data.   Critical Care Time devoted to patient care services,  exclusive of separately billable procedures, described in this note is 35 minutes.   Marshell Garfinkel MD [place] [facility] Pager [phone] If no answer call [phone] 05/16/2019, 8:15 AM

## 2019-05-16 NOTE — Progress Notes (Signed)
Peripherally Inserted Central Catheter/Midline Placement  The IV Nurse has discussed with the patient and/or persons authorized to consent for the patient, the purpose of this procedure and the potential benefits and risks involved with this procedure.  The benefits include less needle sticks, lab draws from the catheter, and the patient may be discharged home with the catheter. Risks include, but not limited to, infection, bleeding, blood clot (thrombus formation), and puncture of an artery; nerve damage and irregular heartbeat and possibility to perform a PICC exchange if needed/ordered by physician.  Alternatives to this procedure were also discussed.  Bard Power PICC patient education guide, fact sheet on infection prevention and patient information card has been provided to patient /or left at bedside. Telephone consent given by daughter.  PICC/Midline Placement Documentation  PICC Double Lumen 05/16/19 PICC Right Brachial 37 cm 0 cm (Active)  Indication for Insertion or Continuance of Line Prolonged intravenous therapies 05/16/19 1500  Exposed Catheter (cm) 0 cm 05/16/19 1500  Site Assessment Clean;Dry;Intact 05/16/19 1500  Lumen #1 Status Flushed;Blood return noted;Saline locked 05/16/19 1500  Lumen #2 Status Flushed;Blood return noted;Saline locked 05/16/19 1500  Dressing Type Transparent 05/16/19 1500  Dressing Status Clean;Dry;Intact;Antimicrobial disc in place 05/16/19 1500  Castine checked and tightened 05/16/19 1500  Dressing Change Due 05/23/19 05/16/19 1500       Teresa Barker 05/16/2019, 3:49 PM

## 2019-05-16 NOTE — Progress Notes (Signed)
Spoke with Agricultural consultant concerning PICC order. Patient is ventilated, has two PIVs, and needs access to replace a Central line.

## 2019-05-17 ENCOUNTER — Inpatient Hospital Stay (HOSPITAL_COMMUNITY): Payer: Medicare HMO

## 2019-05-17 DIAGNOSIS — J9600 Acute respiratory failure, unspecified whether with hypoxia or hypercapnia: Secondary | ICD-10-CM | POA: Diagnosis not present

## 2019-05-17 DIAGNOSIS — U071 COVID-19: Secondary | ICD-10-CM | POA: Diagnosis not present

## 2019-05-17 LAB — COMPREHENSIVE METABOLIC PANEL
ALT: 93 U/L — ABNORMAL HIGH (ref 0–44)
AST: 48 U/L — ABNORMAL HIGH (ref 15–41)
Albumin: 2.1 g/dL — ABNORMAL LOW (ref 3.5–5.0)
Alkaline Phosphatase: 127 U/L — ABNORMAL HIGH (ref 38–126)
Anion gap: 7 (ref 5–15)
BUN: 23 mg/dL (ref 8–23)
CO2: 28 mmol/L (ref 22–32)
Calcium: 8.1 mg/dL — ABNORMAL LOW (ref 8.9–10.3)
Chloride: 109 mmol/L (ref 98–111)
Creatinine, Ser: 0.66 mg/dL (ref 0.44–1.00)
GFR calc Af Amer: >60 mL/min (ref >60)
GFR calc non Af Amer: >60 mL/min (ref >60)
Glucose, Bld: 157 mg/dL — ABNORMAL HIGH (ref 70–99)
Potassium: 4.1 mmol/L (ref 3.5–5.1)
Sodium: 144 mmol/L (ref 135–145)
Total Bilirubin: 0.6 mg/dL (ref 0.3–1.2)
Total Protein: 5.4 g/dL — ABNORMAL LOW (ref 6.5–8.1)

## 2019-05-17 LAB — POCT I-STAT 7, (LYTES, BLD GAS, ICA,H+H)
Acid-Base Excess: 5 mmol/L — ABNORMAL HIGH (ref 0.0–2.0)
Acid-Base Excess: 2 mmol/L (ref 0.0–2.0)
Acid-Base Excess: 5 mmol/L — ABNORMAL HIGH (ref 0.0–2.0)
Bicarbonate: 31.8 mmol/L — ABNORMAL HIGH (ref 20.0–28.0)
Bicarbonate: 33.3 mmol/L — ABNORMAL HIGH (ref 20.0–28.0)
Bicarbonate: 31.4 mmol/L — ABNORMAL HIGH (ref 20.0–28.0)
Calcium, Ion: 1.25 mmol/L (ref 1.15–1.40)
Calcium, Ion: 1.29 mmol/L (ref 1.15–1.40)
Calcium, Ion: 1.25 mmol/L (ref 1.15–1.40)
HCT: 35 % — ABNORMAL LOW (ref 36.0–46.0)
HCT: 36 % (ref 36.0–46.0)
HCT: 32 % — ABNORMAL LOW (ref 36.0–46.0)
Hemoglobin: 11.9 g/dL — ABNORMAL LOW (ref 12.0–15.0)
Hemoglobin: 12.2 g/dL (ref 12.0–15.0)
Hemoglobin: 10.9 g/dL — ABNORMAL LOW (ref 12.0–15.0)
O2 Saturation: 91 %
O2 Saturation: 98 %
O2 Saturation: 91 %
Patient temperature: 99.6
Patient temperature: 97.9
Patient temperature: 36.9
Potassium: 4.3 mmol/L (ref 3.5–5.1)
Potassium: 4.9 mmol/L (ref 3.5–5.1)
Potassium: 4.2 mmol/L (ref 3.5–5.1)
Sodium: 141 mmol/L (ref 135–145)
Sodium: 142 mmol/L (ref 135–145)
Sodium: 144 mmol/L (ref 135–145)
TCO2: 34 mmol/L — ABNORMAL HIGH (ref 22–32)
TCO2: 36 mmol/L — ABNORMAL HIGH (ref 22–32)
TCO2: 33 mmol/L — ABNORMAL HIGH (ref 22–32)
pCO2 arterial: 59 mmHg — ABNORMAL HIGH (ref 32.0–48.0)
pCO2 arterial: 89.4 mmHg (ref 32.0–48.0)
pCO2 arterial: 56.1 mmHg — ABNORMAL HIGH (ref 32.0–48.0)
pH, Arterial: 7.342 — ABNORMAL LOW (ref 7.350–7.450)
pH, Arterial: 7.178 — CL (ref 7.350–7.450)
pH, Arterial: 7.356 (ref 7.350–7.450)
pO2, Arterial: 68 mmHg — ABNORMAL LOW (ref 83.0–108.0)
pO2, Arterial: 130 mmHg — ABNORMAL HIGH (ref 83.0–108.0)
pO2, Arterial: 64 mmHg — ABNORMAL LOW (ref 83.0–108.0)

## 2019-05-17 LAB — CBC WITH DIFFERENTIAL/PLATELET
Abs Immature Granulocytes: 1.06 10*3/uL — ABNORMAL HIGH (ref 0.00–0.07)
Basophils Absolute: 0.1 10*3/uL (ref 0.0–0.1)
Basophils Relative: 0 %
Eosinophils Absolute: 0 10*3/uL (ref 0.0–0.5)
Eosinophils Relative: 0 %
HCT: 36.5 % (ref 36.0–46.0)
Hemoglobin: 10.5 g/dL — ABNORMAL LOW (ref 12.0–15.0)
Immature Granulocytes: 4 %
Lymphocytes Relative: 7 %
Lymphs Abs: 1.8 10*3/uL (ref 0.7–4.0)
MCH: 26.2 pg (ref 26.0–34.0)
MCHC: 28.8 g/dL — ABNORMAL LOW (ref 30.0–36.0)
MCV: 91 fL (ref 80.0–100.0)
Monocytes Absolute: 1.8 10*3/uL — ABNORMAL HIGH (ref 0.1–1.0)
Monocytes Relative: 7 %
Neutro Abs: 20.8 10*3/uL — ABNORMAL HIGH (ref 1.7–7.7)
Neutrophils Relative %: 82 %
Platelets: 263 10*3/uL (ref 150–400)
RBC: 4.01 MIL/uL (ref 3.87–5.11)
RDW: 16.3 % — ABNORMAL HIGH (ref 11.5–15.5)
WBC: 25.6 10*3/uL — ABNORMAL HIGH (ref 4.0–10.5)
nRBC: 0.1 % (ref 0.0–0.2)

## 2019-05-17 LAB — D-DIMER, QUANTITATIVE: D-Dimer, Quant: 1.41 ug/mL-FEU — ABNORMAL HIGH (ref 0.00–0.50)

## 2019-05-17 LAB — GLUCOSE, CAPILLARY
Glucose-Capillary: 155 mg/dL — ABNORMAL HIGH (ref 70–99)
Glucose-Capillary: 159 mg/dL — ABNORMAL HIGH (ref 70–99)
Glucose-Capillary: 170 mg/dL — ABNORMAL HIGH (ref 70–99)
Glucose-Capillary: 185 mg/dL — ABNORMAL HIGH (ref 70–99)
Glucose-Capillary: 223 mg/dL — ABNORMAL HIGH (ref 70–99)

## 2019-05-17 LAB — MAGNESIUM: Magnesium: 2.2 mg/dL (ref 1.7–2.4)

## 2019-05-17 LAB — TRIGLYCERIDES: Triglycerides: 221 mg/dL — ABNORMAL HIGH (ref <150)

## 2019-05-17 LAB — PHOSPHORUS: Phosphorus: 2.4 mg/dL — ABNORMAL LOW (ref 2.5–4.6)

## 2019-05-17 LAB — C-REACTIVE PROTEIN: CRP: 8.9 mg/dL — ABNORMAL HIGH (ref <1.0)

## 2019-05-17 MED ORDER — LINAGLIPTIN 5 MG PO TABS
5.0000 mg | ORAL_TABLET | Freq: Every day | ORAL | Status: DC
  Administered 2019-05-17 – 2019-05-30 (×14): 5 mg via ORAL
  Filled 2019-05-17 (×14): qty 1

## 2019-05-17 NOTE — Progress Notes (Signed)
Assisted tele visit to patient with family member.  Teresa Barker M, RN  

## 2019-05-17 NOTE — Plan of Care (Signed)
Patient Summary: No adverse events throughout the night. Patient remained on the ventilator throughout the night. See MAR re: sedation and pressors with titration and medications administered. No acute distress noted. No ventilator changes made. All lines and tubes remain intact. See flowsheet re: assessment. Patient remained >90% O2 sat. MAP >65. Patient daughter updated overnight. Addressed all questions and concerns. Bed in low locked position. Plan of care continued.  Problem: Education: Goal: Knowledge of General Education information will improve Description: Including pain rating scale, medication(s)/side effects and non-pharmacologic comfort measures Outcome: Progressing   Problem: Health Behavior/Discharge Planning: Goal: Ability to manage health-related needs will improve Outcome: Progressing   Problem: Clinical Measurements: Goal: Ability to maintain clinical measurements within normal limits will improve Outcome: Progressing Goal: Will remain free from infection Outcome: Progressing Goal: Diagnostic test results will improve Outcome: Progressing Goal: Respiratory complications will improve Outcome: Progressing Goal: Cardiovascular complication will be avoided Outcome: Progressing   Problem: Activity: Goal: Risk for activity intolerance will decrease Outcome: Progressing   Problem: Nutrition: Goal: Adequate nutrition will be maintained Outcome: Progressing   Problem: Coping: Goal: Level of anxiety will decrease Outcome: Progressing   Problem: Elimination: Goal: Will not experience complications related to bowel motility Outcome: Progressing Goal: Will not experience complications related to urinary retention Outcome: Progressing   Problem: Pain Managment: Goal: General experience of comfort will improve Outcome: Progressing   Problem: Safety: Goal: Ability to remain free from injury will improve Outcome: Progressing   Problem: Skin Integrity: Goal: Risk for  impaired skin integrity will decrease Outcome: Progressing

## 2019-05-17 NOTE — Progress Notes (Signed)
Patient transported on vent to CT and returned to [id number] without complications.

## 2019-05-17 NOTE — Progress Notes (Addendum)
PCCM progress note  Called to the bedside as had brief episode of SVT with heart rate in the 180s with desats to 60%  Blood pressure 112/73, pulse (!) 129, temperature 97.9 F (36.6 C), temperature source Oral, resp. rate 20, height 4\' 11"  (1.499 m), weight 78.1 kg, SpO2 91 %. Gen:      No acute distress HEENT:  EOMI, sclera anicteric, crepitus over neck and anterior chest Neck:     No masses; no thyromegaly, ETT Lungs:    Clear to auscultation bilaterally; normal respiratory effort CV:         Regular rate and rhythm; no murmurs Abd:      + bowel sounds; soft, non-tender; no palpable masses, no distension Ext:    No edema; adequate peripheral perfusion Skin:      Warm and dry; no rash Neuro: Sedated  Assessment/Plan: Severe ARDS secondary to COVID-19  CT scan reviewed with extensive pneumomediastinum, pneumopericardium. No pneumothorax on follow-up chest x-ray.  Attempted vent changes on volume control to reduce peak, plateau pressures with low tidal volumes. However patient became severely hypercarbic with pH of 7.1 Switched to pressure control mode with PEEP of 8 and inspiratory pressure of 16 She is receiving 200- 250cc tidal volumes with this mode which is around 5-6 cc/kg Plt pressure is < 30 Check a repeat ABG on the settings  Called family and discussed with daughter and son-in-law.  Recommended DNR status as she may deteriorate and go into cardiac arrest.  Family is not ready to make a decision yet  The patient is critically ill with multiple organ system failure and requires high complexity decision making for assessment and support, frequent evaluation and titration of therapies, advanced monitoring, review of radiographic studies and interpretation of complex data.   Critical Care Time devoted to patient care services, exclusive of separately billable procedures, described in this note is 35 minutes.   Marshell Garfinkel MD [place] [facility] Pager [phone] If no answer call [phone] 05/17/2019, 3:00 PM

## 2019-05-17 NOTE — Progress Notes (Addendum)
Critical ABG values given to Dr. Vaughan Browner at bedside.  Vent changed made by CCM.  Ph 7.102 PCO2 >97.0 PO2 62  Na 143 K 4.9 iCa 1.28 Hct 37 Hb 12.6

## 2019-05-17 NOTE — Progress Notes (Signed)
NAME:  Teresa Barker, MRN:  [id number], DOB:  1950/07/09, LOS: 2 ADMISSION DATE:  05/16/2019, CONSULTATION DATE: 05/14/2019 REFERRING MD: Derrill Kay, MD, CHIEF COMPLAINT: COVID-19 pneumonia  Brief History   68 year old with COPD, diabetes, obesity, asthma Admitted to [facility] around 11/10 with COVID-19 pneumonia.  Initially on heated high flow with progressive respiratory failure needing BiPAP for the past 4 days. Intubated for progressive respiratory failure and transferred to [facility].  Past Medical History  COPD, diabetes, obesity, asthma, GERD, hypertension, neuropathy  Significant Hospital Events   11/8- Admit to [facility] 11/19- Intubated, transfer to [facility] 11/21- New onset afib on admission > converted to NSR  Consults:    Procedures:  Femoral triple-lumen 11/19 outside hospital > 11/21 Rt PICC 11/21 >>   Significant Diagnostic Tests:    Micro Data:  SARS COV2 11/7 at [facility]  Antimicrobials:  Completed remdesivir at [facility]  Interim history/subjective:  Remains intubated, sedated High plateau pressures  Objective   Blood pressure 93/62, pulse 87, temperature 97.9 F (36.6 C), temperature source Oral, resp. rate 16, height 4\' 11"  (1.499 m), weight 78.1 kg, SpO2 92 %.    Vent Mode: PRVC FiO2 (%):  [50 %] 50 % Set Rate:  [35 bmp] 35 bmp Vt Set:  [200 mL-210 mL] 200 mL PEEP:  [16 cmH20] 16 cmH20 Plateau Pressure:  [26 cmH20-30 cmH20] 26 cmH20   Intake/Output Summary (Last 24 hours) at 05/17/2019 0844 Last data filed at 05/17/2019 [id number] Gross per 24 hour  Intake 2077.56 ml  Output 2470 ml  Net -392.44 ml   Filed Weights   05/12/2019 0400 05/16/19 0500 05/17/19 0400  Weight: 78.8 kg 74.6 kg 78.1 kg    Examination: Gen:      No acute distress, crepitus over neck and chest HEENT:  EOMI, sclera anicteric Neck:     No masses; no thyromegaly, ETT Lungs:    Clear to auscultation bilaterally; normal respiratory  effort CV:         Regular and rhythm; no murmurs Abd:      + bowel sounds; soft, non-tender; no palpable masses, no distension Ext:    No edema; adequate peripheral perfusion Skin:      Warm and dry; no rash Neuro: Sedated, [facility] Problem list     Assessment & Plan:  Severe ARDS secondary to COVID-19 pneumonia COPD ? Pnemomediastinum ARDSnet ventilation At TV 5cc/kg as plateau pressure is high Titrate PEEP to maintain driving pressure less than 15 CT chest to evaluate subq air and possible pneumopericardium, pneumomedistinum We are holding off proning as P/F ratio for now due to above issue Lasix as needed to keep euvoluemic Continue Decadron Continue nebs as needed  Hyperglycemia Continue SSI coverage Levemir Add linagliptan  New onset A fib > now in NSR Hypotension due to sedation Holding outpatient losartan, cardizem Norepinephrine  Best practice:  Diet: Tube feeds Pain/Anxiety/Delirium protocol (if indicated): Propofol, fentanyl, versed VAP protocol (if indicated): NA DVT prophylaxis: Lovenox GI prophylaxis: Start pepcid Glucose control: SSI, levemir Mobility: Bed Code Status: Full Family Communication: Daughter [name] called and updated 11/21 Disposition: ICU  Labs   CBC: Recent Labs  Lab 04/29/2019 0520  05/16/19 0400 05/16/19 0413 05/16/19 1438 05/17/19 0320 05/17/19 0426  WBC 14.7*  --  27.9*  --   --   --  25.6*  NEUTROABS  --   --  24.1*  --   --   --  20.8*  HGB 11.0*   < >  10.9* 11.9* 12.2 11.9* 10.5*  HCT 36.6   < > 37.8 35.0* 36.0 35.0* 36.5  MCV 88.2  --  90.0  --   --   --  91.0  PLT 291  --  326  --   --   --  263   < > = values in this interval not displayed.    Basic Metabolic Panel: Recent Labs  Lab 05/06/2019 0520  05/23/2019 1347  05/16/19 0400 05/16/19 0413 05/16/19 1438 05/17/19 0320 05/17/19 0426  NA  --    < > 140   < > 142 142 141 141 144  K  --    < > 4.1   < > 3.2* 3.2* 4.0 4.3 4.1  CL  --   --   105  --  107  --   --   --  109  CO2  --   --  26  --  27  --   --   --  28  GLUCOSE  --   --  244*  --  114*  --   --   --  157*  BUN  --   --  18  --  21  --   --   --  23  CREATININE 0.63  --  0.71  --  0.72  --   --   --  0.66  CALCIUM  --   --  7.7*  --  8.0*  --   --   --  8.1*  MG 2.0  --   --   --  2.3  --   --   --  2.2  PHOS  --   --   --   --  3.1  --   --   --  2.4*   < > = values in this interval not displayed.   GFR: Estimated Creatinine Clearance: 62.5 mL/min (by C-G formula based on SCr of 0.66 mg/dL). Recent Labs  Lab 05/02/2019 0520 05/22/2019 1348 05/16/19 0400 05/17/19 0426  PROCALCITON <0.10  --   --   --   WBC 14.7*  --  27.9* 25.6*  LATICACIDVEN  --  2.1*  --   --     Liver Function Tests: Recent Labs  Lab 05/21/2019 1347 05/16/19 0400 05/17/19 0426  AST 65* 68* 48*  ALT 76* 91* 93*  ALKPHOS 160* 138* 127*  BILITOT 0.8 1.2 0.6  PROT 5.9* 5.3* 5.4*  ALBUMIN 2.3* 2.2* 2.1*   No results for input(s): LIPASE, AMYLASE in the last 168 hours. No results for input(s): AMMONIA in the last 168 hours.  ABG    Component Value Date/Time   PHART 7.342 (L) 05/17/2019 0320   PCO2ART 59.0 (H) 05/17/2019 0320   PO2ART 68.0 (L) 05/17/2019 0320   HCO3 31.8 (H) 05/17/2019 0320   TCO2 34 (H) 05/17/2019 0320   ACIDBASEDEF 1.0 05/20/2019 1149   O2SAT 91.0 05/17/2019 0320     Coagulation Profile: No results for input(s): INR, PROTIME in the last 168 hours.  Cardiac Enzymes: No results for input(s): CKTOTAL, CKMB, CKMBINDEX, TROPONINI in the last 168 hours.  HbA1C: Hgb A1c MFr Bld  Date/Time Value Ref Range Status  04/28/2019 05:20 AM 9.8 (H) 4.8 - 5.6 % Final    Comment:    (NOTE) Pre diabetes:          5.7%-6.4% Diabetes:              >6.4% Glycemic control  for   <7.0% adults with diabetes     CBG: Recent Labs  Lab 05/16/19 1619 05/16/19 2029 05/16/19 2355 05/17/19 0328 05/17/19 0735  GLUCAP 233* 204* 185* 159* 170*    Critical care time: 35    The patient is critically ill with multiple organ system failure and requires high complexity decision making for assessment and support, frequent evaluation and titration of therapies, advanced monitoring, review of radiographic studies and interpretation of complex data.   Critical Care Time devoted to patient care services, exclusive of separately billable procedures, described in this note is 35 minutes.   Marshell Garfinkel MD [place] [facility] Pager [phone] If no answer call [phone] 05/17/2019, 8:48 AM

## 2019-05-17 NOTE — Progress Notes (Signed)
ABG critical values given to Dr. Vaughan Browner.  Changed Peep to 5 per MD request.  ABG results: PH 7.178 PCO2 89.4 PAO2 130 HCO3 33.3  Na 142 K 4.9 iCa 1.29 Hct 36 Hb 12.2

## 2019-05-18 ENCOUNTER — Inpatient Hospital Stay (HOSPITAL_COMMUNITY): Payer: Medicare HMO

## 2019-05-18 DIAGNOSIS — U071 COVID-19: Secondary | ICD-10-CM | POA: Diagnosis not present

## 2019-05-18 DIAGNOSIS — J982 Interstitial emphysema: Secondary | ICD-10-CM

## 2019-05-18 DIAGNOSIS — J9600 Acute respiratory failure, unspecified whether with hypoxia or hypercapnia: Secondary | ICD-10-CM | POA: Diagnosis not present

## 2019-05-18 DIAGNOSIS — I361 Nonrheumatic tricuspid (valve) insufficiency: Secondary | ICD-10-CM | POA: Diagnosis not present

## 2019-05-18 DIAGNOSIS — J439 Emphysema, unspecified: Secondary | ICD-10-CM | POA: Diagnosis not present

## 2019-05-18 LAB — COMPREHENSIVE METABOLIC PANEL
ALT: 76 U/L — ABNORMAL HIGH (ref 0–44)
AST: 48 U/L — ABNORMAL HIGH (ref 15–41)
Albumin: 1.8 g/dL — ABNORMAL LOW (ref 3.5–5.0)
Alkaline Phosphatase: 139 U/L — ABNORMAL HIGH (ref 38–126)
Anion gap: 6 (ref 5–15)
BUN: 19 mg/dL (ref 8–23)
CO2: 31 mmol/L (ref 22–32)
Calcium: 8.3 mg/dL — ABNORMAL LOW (ref 8.9–10.3)
Chloride: 108 mmol/L (ref 98–111)
Creatinine, Ser: 0.46 mg/dL (ref 0.44–1.00)
GFR calc Af Amer: >60 mL/min (ref >60)
GFR calc non Af Amer: >60 mL/min (ref >60)
Glucose, Bld: 190 mg/dL — ABNORMAL HIGH (ref 70–99)
Potassium: 4.6 mmol/L (ref 3.5–5.1)
Sodium: 145 mmol/L (ref 135–145)
Total Bilirubin: 1.3 mg/dL — ABNORMAL HIGH (ref 0.3–1.2)
Total Protein: 5.2 g/dL — ABNORMAL LOW (ref 6.5–8.1)

## 2019-05-18 LAB — CBC WITH DIFFERENTIAL/PLATELET
Abs Immature Granulocytes: 0.47 10*3/uL — ABNORMAL HIGH (ref 0.00–0.07)
Abs Immature Granulocytes: 0.85 10*3/uL — ABNORMAL HIGH (ref 0.00–0.07)
Basophils Absolute: 0 10*3/uL (ref 0.0–0.1)
Basophils Absolute: 0.1 10*3/uL (ref 0.0–0.1)
Basophils Relative: 0 %
Basophils Relative: 0 %
Eosinophils Absolute: 0.1 10*3/uL (ref 0.0–0.5)
Eosinophils Absolute: 0.1 10*3/uL (ref 0.0–0.5)
Eosinophils Relative: 0 %
Eosinophils Relative: 0 %
HCT: 34.8 % — ABNORMAL LOW (ref 36.0–46.0)
HCT: 36.7 % (ref 36.0–46.0)
Hemoglobin: 9.8 g/dL — ABNORMAL LOW (ref 12.0–15.0)
Hemoglobin: 10.4 g/dL — ABNORMAL LOW (ref 12.0–15.0)
Immature Granulocytes: 3 %
Immature Granulocytes: 4 %
Lymphocytes Relative: 7 %
Lymphocytes Relative: 3 %
Lymphs Abs: 1.3 10*3/uL (ref 0.7–4.0)
Lymphs Abs: 0.6 10*3/uL — ABNORMAL LOW (ref 0.7–4.0)
MCH: 25.8 pg — ABNORMAL LOW (ref 26.0–34.0)
MCH: 26.5 pg (ref 26.0–34.0)
MCHC: 28.2 g/dL — ABNORMAL LOW (ref 30.0–36.0)
MCHC: 28.3 g/dL — ABNORMAL LOW (ref 30.0–36.0)
MCV: 91.6 fL (ref 80.0–100.0)
MCV: 93.6 fL (ref 80.0–100.0)
Monocytes Absolute: 0.5 10*3/uL (ref 0.1–1.0)
Monocytes Absolute: 0.7 10*3/uL (ref 0.1–1.0)
Monocytes Relative: 3 %
Monocytes Relative: 3 %
Neutro Abs: 15.5 10*3/uL — ABNORMAL HIGH (ref 1.7–7.7)
Neutro Abs: 22 10*3/uL — ABNORMAL HIGH (ref 1.7–7.7)
Neutrophils Relative %: 87 %
Neutrophils Relative %: 90 %
Platelets: 185 10*3/uL (ref 150–400)
Platelets: 189 10*3/uL (ref 150–400)
RBC: 3.8 MIL/uL — ABNORMAL LOW (ref 3.87–5.11)
RBC: 3.92 MIL/uL (ref 3.87–5.11)
RDW: 16.9 % — ABNORMAL HIGH (ref 11.5–15.5)
RDW: 17.1 % — ABNORMAL HIGH (ref 11.5–15.5)
WBC: 17.8 10*3/uL — ABNORMAL HIGH (ref 4.0–10.5)
WBC: 24.3 10*3/uL — ABNORMAL HIGH (ref 4.0–10.5)
nRBC: 0.1 % (ref 0.0–0.2)
nRBC: 0.1 % (ref 0.0–0.2)

## 2019-05-18 LAB — GLUCOSE, CAPILLARY
Glucose-Capillary: 151 mg/dL — ABNORMAL HIGH (ref 70–99)
Glucose-Capillary: 163 mg/dL — ABNORMAL HIGH (ref 70–99)
Glucose-Capillary: 164 mg/dL — ABNORMAL HIGH (ref 70–99)
Glucose-Capillary: 197 mg/dL — ABNORMAL HIGH (ref 70–99)
Glucose-Capillary: 231 mg/dL — ABNORMAL HIGH (ref 70–99)
Glucose-Capillary: 243 mg/dL — ABNORMAL HIGH (ref 70–99)
Glucose-Capillary: 286 mg/dL — ABNORMAL HIGH (ref 70–99)
Glucose-Capillary: 294 mg/dL — ABNORMAL HIGH (ref 70–99)

## 2019-05-18 LAB — POCT I-STAT 7, (LYTES, BLD GAS, ICA,H+H)
Acid-Base Excess: 9 mmol/L — ABNORMAL HIGH (ref 0.0–2.0)
Acid-Base Excess: 4 mmol/L — ABNORMAL HIGH (ref 0.0–2.0)
Acid-Base Excess: 7 mmol/L — ABNORMAL HIGH (ref 0.0–2.0)
Bicarbonate: 33.3 mmol/L — ABNORMAL HIGH (ref 20.0–28.0)
Bicarbonate: 33.8 mmol/L — ABNORMAL HIGH (ref 20.0–28.0)
Bicarbonate: 32.9 mmol/L — ABNORMAL HIGH (ref 20.0–28.0)
Calcium, Ion: 1.23 mmol/L (ref 1.15–1.40)
Calcium, Ion: 1.27 mmol/L (ref 1.15–1.40)
Calcium, Ion: 1.22 mmol/L (ref 1.15–1.40)
HCT: 32 % — ABNORMAL LOW (ref 36.0–46.0)
HCT: 35 % — ABNORMAL LOW (ref 36.0–46.0)
HCT: 29 % — ABNORMAL LOW (ref 36.0–46.0)
Hemoglobin: 10.9 g/dL — ABNORMAL LOW (ref 12.0–15.0)
Hemoglobin: 11.9 g/dL — ABNORMAL LOW (ref 12.0–15.0)
Hemoglobin: 9.9 g/dL — ABNORMAL LOW (ref 12.0–15.0)
O2 Saturation: 93 %
O2 Saturation: 77 %
O2 Saturation: 95 %
Patient temperature: 39
Patient temperature: 37.3
Patient temperature: 36.5
Potassium: 4.4 mmol/L (ref 3.5–5.1)
Potassium: 5.7 mmol/L — ABNORMAL HIGH (ref 3.5–5.1)
Potassium: 5.7 mmol/L — ABNORMAL HIGH (ref 3.5–5.1)
Sodium: 140 mmol/L (ref 135–145)
Sodium: 140 mmol/L (ref 135–145)
Sodium: 139 mmol/L (ref 135–145)
TCO2: 35 mmol/L — ABNORMAL HIGH (ref 22–32)
TCO2: 36 mmol/L — ABNORMAL HIGH (ref 22–32)
TCO2: 35 mmol/L — ABNORMAL HIGH (ref 22–32)
pCO2 arterial: 48.5 mmHg — ABNORMAL HIGH (ref 32.0–48.0)
pCO2 arterial: 83.1 mmHg (ref 32.0–48.0)
pCO2 arterial: 54.1 mmHg — ABNORMAL HIGH (ref 32.0–48.0)
pH, Arterial: 7.452 — ABNORMAL HIGH (ref 7.350–7.450)
pH, Arterial: 7.219 — ABNORMAL LOW (ref 7.350–7.450)
pH, Arterial: 7.39 (ref 7.350–7.450)
pO2, Arterial: 73 mmHg — ABNORMAL LOW (ref 83.0–108.0)
pO2, Arterial: 53 mmHg — ABNORMAL LOW (ref 83.0–108.0)
pO2, Arterial: 78 mmHg — ABNORMAL LOW (ref 83.0–108.0)

## 2019-05-18 LAB — C-REACTIVE PROTEIN: CRP: 19.3 mg/dL — ABNORMAL HIGH (ref <1.0)

## 2019-05-18 LAB — PHOSPHORUS: Phosphorus: 1.2 mg/dL — ABNORMAL LOW (ref 2.5–4.6)

## 2019-05-18 LAB — D-DIMER, QUANTITATIVE: D-Dimer, Quant: 3.51 ug/mL-FEU — ABNORMAL HIGH (ref 0.00–0.50)

## 2019-05-18 LAB — ECHOCARDIOGRAM COMPLETE
Height: 59 in
Weight: 2814.83 oz

## 2019-05-18 LAB — MAGNESIUM: Magnesium: 2 mg/dL (ref 1.7–2.4)

## 2019-05-18 MED ORDER — CLONAZEPAM 0.1 MG/ML ORAL SUSPENSION: 1.0000 mg | Freq: Two times a day (BID) | ORAL | Status: DC

## 2019-05-18 MED ORDER — ARTIFICIAL TEARS OPHTHALMIC OINT
1.0000 "application " | TOPICAL_OINTMENT | Freq: Three times a day (TID) | OPHTHALMIC | Status: DC
  Administered 2019-05-18 – 2019-05-25 (×19): 1 via OPHTHALMIC
  Filled 2019-05-18 (×2): qty 3.5

## 2019-05-18 MED ORDER — CLONAZEPAM 1 MG PO TABS
1.0000 mg | ORAL_TABLET | Freq: Two times a day (BID) | ORAL | Status: DC
  Administered 2019-05-18 – 2019-05-21 (×7): 1 mg via ORAL
  Filled 2019-05-18 (×7): qty 1

## 2019-05-18 MED ORDER — FENTANYL BOLUS VIA INFUSION
25.0000 ug | INTRAVENOUS | Status: DC | PRN
  Administered 2019-05-18 – 2019-05-19 (×5): 25 ug via INTRAVENOUS
  Administered 2019-05-20 (×2): 100 ug via INTRAVENOUS
  Filled 2019-05-18: qty 25

## 2019-05-18 MED ORDER — ARTIFICIAL TEARS OPHTHALMIC OINT: 1.0000 "application " | TOPICAL_OINTMENT | Freq: Three times a day (TID) | OPHTHALMIC | Status: DC

## 2019-05-18 MED ORDER — CHLORHEXIDINE GLUCONATE 0.12 % MT SOLN
15.0000 mL | Freq: Two times a day (BID) | OROMUCOSAL | Status: DC
  Administered 2019-05-18 – 2019-05-30 (×24): 15 mL via OROMUCOSAL
  Filled 2019-05-18 (×11): qty 15

## 2019-05-18 MED ORDER — FENTANYL 2500MCG IN NS 250ML (10MCG/ML) PREMIX INFUSION
25.0000 ug/h | INTRAVENOUS | Status: DC
  Administered 2019-05-18: 175 ug/h via INTRAVENOUS
  Administered 2019-05-18: 225 ug/h via INTRAVENOUS
  Administered 2019-05-19: 50 ug/h via INTRAVENOUS
  Administered 2019-05-20: 17:00:00 75 ug/h via INTRAVENOUS
  Administered 2019-05-21 – 2019-05-22 (×2): 150 ug/h via INTRAVENOUS
  Administered 2019-05-22: 250 ug/h via INTRAVENOUS
  Filled 2019-05-18 (×6): qty 250

## 2019-05-18 MED ORDER — SODIUM PHOSPHATES 45 MMOLE/15ML IV SOLN
30.0000 mmol | Freq: Once | INTRAVENOUS | Status: DC
  Filled 2019-05-18: qty 10

## 2019-05-18 MED ORDER — MIDAZOLAM BOLUS VIA INFUSION
1.0000 mg | INTRAVENOUS | Status: DC | PRN
  Administered 2019-05-18 – 2019-05-19 (×3): 1 mg via INTRAVENOUS
  Administered 2019-05-19 – 2019-05-20 (×6): 2 mg via INTRAVENOUS
  Filled 2019-05-18: qty 2

## 2019-05-18 MED ORDER — OXYCODONE HCL 5 MG/5ML PO SOLN
5.0000 mg | Freq: Four times a day (QID) | ORAL | Status: DC
  Administered 2019-05-18 – 2019-05-21 (×13): 5 mg via ORAL
  Filled 2019-05-18 (×13): qty 5

## 2019-05-18 MED ORDER — MIDAZOLAM 50MG/50ML (1MG/ML) PREMIX INFUSION
2.0000 mg/h | INTRAVENOUS | Status: DC
  Administered 2019-05-18: 19:00:00 3 mg/h via INTRAVENOUS
  Administered 2019-05-18: 4 mg/h via INTRAVENOUS
  Administered 2019-05-19: 18:00:00 2 mg/h via INTRAVENOUS
  Administered 2019-05-20: 20:00:00 4 mg/h via INTRAVENOUS
  Administered 2019-05-20: 01:00:00 3 mg/h via INTRAVENOUS
  Administered 2019-05-21: 6 mg/h via INTRAVENOUS
  Administered 2019-05-22: 4 mg/h via INTRAVENOUS
  Administered 2019-05-22: 15:00:00 8 mg/h via INTRAVENOUS
  Administered 2019-05-22 – 2019-05-23 (×2): 6 mg/h via INTRAVENOUS
  Administered 2019-05-23 (×2): 7 mg/h via INTRAVENOUS
  Administered 2019-05-23: 6 mg/h via INTRAVENOUS
  Administered 2019-05-24 (×2): 7 mg/h via INTRAVENOUS
  Administered 2019-05-24: 9 mg/h via INTRAVENOUS
  Administered 2019-05-24: 6 mg/h via INTRAVENOUS
  Administered 2019-05-25 (×2): 8 mg/h via INTRAVENOUS
  Administered 2019-05-25: 03:00:00 9 mg/h via INTRAVENOUS
  Administered 2019-05-25: 10 mg/h via INTRAVENOUS
  Filled 2019-05-18 (×21): qty 50

## 2019-05-18 MED ORDER — VECURONIUM BROMIDE 10 MG IV SOLR
0.1000 mg/kg | INTRAVENOUS | Status: DC | PRN
  Administered 2019-05-18 (×2): 8 mg via INTRAVENOUS
  Filled 2019-05-18: qty 10

## 2019-05-18 MED ORDER — HEPARIN (PORCINE) 25000 UT/250ML-% IV SOLN
1000.0000 [IU]/h | INTRAVENOUS | Status: DC
  Administered 2019-05-18: 1000 [IU]/h via INTRAVENOUS
  Filled 2019-05-18: qty 250

## 2019-05-18 MED ORDER — HEPARIN BOLUS VIA INFUSION
2000.0000 [IU] | Freq: Once | INTRAVENOUS | Status: AC
  Administered 2019-05-18: 2000 [IU] via INTRAVENOUS
  Filled 2019-05-18: qty 2000

## 2019-05-18 MED ORDER — VECURONIUM BROMIDE 10 MG IV SOLR
0.0000 ug/kg/min | INTRAVENOUS | Status: DC
  Administered 2019-05-18: 1 ug/kg/min via INTRAVENOUS
  Filled 2019-05-18: qty 100

## 2019-05-18 MED ORDER — K PHOS MONO-SOD PHOS DI & MONO 155-852-130 MG PO TABS
250.0000 mg | ORAL_TABLET | Freq: Three times a day (TID) | ORAL | Status: AC
  Administered 2019-05-18 – 2019-05-19 (×3): 250 mg
  Filled 2019-05-18 (×4): qty 1

## 2019-05-18 MED ORDER — FENTANYL CITRATE (PF) 100 MCG/2ML IJ SOLN
25.0000 ug | Freq: Once | INTRAMUSCULAR | Status: AC
  Administered 2019-05-18: 25 ug via INTRAVENOUS

## 2019-05-18 NOTE — Progress Notes (Addendum)
NAME:  Teresa Barker, MRN:  [id number], DOB:  [birth year], LOS: 3 ADMISSION DATE:  04/28/2019, CONSULTATION DATE:  11/20 REFERRING MD:  Derrill Kay MD, CHIEF COMPLAINT:  COVID 19 pneumonia   Brief History   68 y/o female admitted 11/10 to [facility] for COVID pneumonia, intubated [facility] on 11/19, moved to [facility] ICU on 11/20.    Past Medical History  COPD DM2 Obesity Asthma GERD Hypertension Neuropathy  Significant Hospital Events   11/8- Admit to [facility] 11/19- Intubated, transfer to [facility] 11/21- New onset afib on admission > converted to NSR 11/22 pneumomediastinum and pneumopericardium seen on CT chest  Consults:   Procedures:  Femoral triple-lumen 11/19 outside hospital > 11/21 Rt PICC 11/21 >>   Significant Diagnostic Tests:  11/22 CT chest images personally reviewed: pneumomediastinum and pneumopericardium noted, small bilateral pneumothoraces. Bilateral peripheral predominant ground glass opacification.  Micro Data:  SARS COV2 11/7 at [facility]  Antimicrobials:  Completed remdesivir at [facility]  Interim history/subjective:   Worsening hypoxemia this morning Severely dyssynchronous with vent   Objective   Blood pressure (!) 105/59, pulse (!) 119, temperature (!) 101.1 F (38.4 C), temperature source Esophageal, resp. rate (!) 38, height 4\' 11"  (1.499 m), weight 79.8 kg, SpO2 (!) 89 %.    Vent Mode: PCV FiO2 (%):  [50 %-100 %] 90 % Set Rate:  [35 bmp] 35 bmp PEEP:  [5 cmH20-14 cmH20] 5 cmH20 Plateau Pressure:  [16 [id number] cmH20] 20 cmH20   Intake/Output Summary (Last 24 hours) at 05/18/2019 1023 Last data filed at 05/18/2019 0930 Gross per 24 hour  Intake 2125.86 ml  Output 1325 ml  Net 800.86 ml   Filed Weights   05/16/19 0500 05/17/19 0400 05/18/19 0500  Weight: 74.6 kg 78.1 kg 79.8 kg    Examination:  General:  In bed on vent HENT: NCAT ETT in place PULM: Crackles bases B, vent supported breathing CV: RRR, no mgr GI: BS+, soft,  nontender MSK: normal bulk and tone Neuro: sedated on vent   Resolved Hospital Problem list     Assessment & Plan:  ARDS: worsening hypoxemia, severe ventilator dyssynchrony Continue mechanical ventilation per ARDS protocol Target TVol 6-8cc/kgIBW Target Plateau Pressure < 30cm H20 Target driving pressure less than 15 cm of water Target PaO2 55-65: titrate PEEP/FiO2 per protocol As long as PaO2 to FiO2 ratio is less than 1:150 position in prone position for 16 hours a day Check CVP daily if CVL in place Target CVP less than 4, diurese as necessary Ventilator associated pneumonia prevention protocol 11/23 : if stable enough plan prone positioning, start paratlytics for better ventilator synchrony  Sedation needs for ventilator synchrony RASS target -5 Versed infusion Fentanyl infusion Nimbex infusion Add oxycodone and clonazepam  Pneumomediastinum with pneumopericardium and small bilateral pneumothoraces CXR daily Monitor carefully  Hyperglycemia SSI Detemir insulin linagliptin  Atrial Fib currently in normal sinus rhythm Tele   Best practice:  Diet: tube feeding Pain/Anxiety/Delirium protocol (if indicated): as above VAP protocol (if indicated): yes DVT prophylaxis: lovenox GI prophylaxis: famotidine Glucose control: SSI Mobility: bed rest Code Status: DNR if cardiac arrest Family Communication: I called [name] daughter and updated her and her husband by phone. We talked about the risk of prone positioning and the possibility of cardiac arrest whether we place her in the prone position or not.  They voiced understanding and agree to DNR and place her in prone position. Disposition: remain in ICU  Labs   CBC: Recent Labs  Lab  04/26/2019 0520  05/16/19 0400  05/17/19 0426 05/17/19 1507 05/17/19 2048 05/18/19 0421 05/18/19 0434  WBC 14.7*  --  27.9*  --  25.6*  --   --   --  17.8*  NEUTROABS  --   --  24.1*  --  20.8*  --   --   --  15.5*  HGB 11.0*   < >  10.9*   < > 10.5* 12.2 10.9* 10.9* 9.8*  HCT 36.6   < > 37.8   < > 36.5 36.0 32.0* 32.0* 34.8*  MCV 88.2  --  90.0  --  91.0  --   --   --  91.6  PLT 291  --  326  --  263  --   --   --  185   < > = values in this interval not displayed.    Basic Metabolic Panel: Recent Labs  Lab 04/28/2019 0520  05/10/2019 1347  05/16/19 0400  05/17/19 0426 05/17/19 1507 05/17/19 2048 05/18/19 0421 05/18/19 0434  NA  --    < > 140   < > 142   < > 144 142 144 140 145  K  --    < > 4.1   < > 3.2*   < > 4.1 4.9 4.2 4.4 4.6  CL  --   --  105  --  107  --  109  --   --   --  108  CO2  --   --  26  --  27  --  28  --   --   --  31  GLUCOSE  --   --  244*  --  114*  --  157*  --   --   --  190*  BUN  --   --  18  --  21  --  23  --   --   --  19  CREATININE 0.63  --  0.71  --  0.72  --  0.66  --   --   --  0.46  CALCIUM  --   --  7.7*  --  8.0*  --  8.1*  --   --   --  8.3*  MG 2.0  --   --   --  2.3  --  2.2  --   --   --  2.0  PHOS  --   --   --   --  3.1  --  2.4*  --   --   --  1.2*   < > = values in this interval not displayed.   GFR: Estimated Creatinine Clearance: 63.1 mL/min (by C-G formula based on SCr of 0.46 mg/dL). Recent Labs  Lab 04/30/2019 0520 04/27/2019 1348 05/16/19 0400 05/17/19 0426 05/18/19 0434  PROCALCITON <0.10  --   --   --   --   WBC 14.7*  --  27.9* 25.6* 17.8*  LATICACIDVEN  --  2.1*  --   --   --     Liver Function Tests: Recent Labs  Lab 05/21/2019 1347 05/16/19 0400 05/17/19 0426 05/18/19 0434  AST 65* 68* 48* 48*  ALT 76* 91* 93* 76*  ALKPHOS 160* 138* 127* 139*  BILITOT 0.8 1.2 0.6 1.3*  PROT 5.9* 5.3* 5.4* 5.2*  ALBUMIN 2.3* 2.2* 2.1* 1.8*   No results for input(s): LIPASE, AMYLASE in the last 168 hours. No results for input(s): AMMONIA in the last 168 hours.  ABG    Component Value Date/Time   PHART 7.452 (H) 05/18/2019 0421   PCO2ART 48.5 (H) 05/18/2019 0421   PO2ART 73.0 (L) 05/18/2019 0421   HCO3 33.3 (H) 05/18/2019 0421   TCO2 35 (H)  05/18/2019 0421   ACIDBASEDEF 1.0 05/17/2019 1149   O2SAT 93.0 05/18/2019 0421     Coagulation Profile: No results for input(s): INR, PROTIME in the last 168 hours.  Cardiac Enzymes: No results for input(s): CKTOTAL, CKMB, CKMBINDEX, TROPONINI in the last 168 hours.  HbA1C: Hgb A1c MFr Bld  Date/Time Value Ref Range Status  05/19/2019 05:20 AM 9.8 (H) 4.8 - 5.6 % Final    Comment:    (NOTE) Pre diabetes:          5.7%-6.4% Diabetes:              >6.4% Glycemic control for   <7.0% adults with diabetes     CBG: Recent Labs  Lab 05/17/19 1654 05/17/19 1945 05/17/19 2356 05/18/19 0336 05/18/19 0747  GLUCAP 223* 155* 151* 164* 163*     Critical care time: 90 minutes     Roselie Awkward, MD [place] [facility] Pager: [phone] Cell: [phone] If no response, call [phone]

## 2019-05-18 NOTE — Progress Notes (Signed)
Pt's head turned at this time with no complications.

## 2019-05-18 NOTE — Progress Notes (Addendum)
Patient ETT secured with cloth tape at 23, with foam pads placed on her cheeks, above her upper lip, chin, and back of her neck.  Patient placed in prone position by RT x 2 and RN x 3 without complications.  Z-Flo prone pillow used to support her head.

## 2019-05-18 NOTE — Progress Notes (Signed)
[facility]  Called to bedside for worsening hypoxemia, tachycardia On exam Normal blood pressure Tachycardia, sinus rhtythm Profound hypoxemia Worsening ventilation based on ABG: worsening hypercarbia, PaO2 50 on 100% and 12 PEEP CXR with worsening bilateral infiltrates, no pneumothorax or pneumomediastinum  Plan Continuous paralytic Emergent bronchoscopy: mucus plugging? STAT echocardiogram: dilated RV? Start empiric anticoagulation for possible PE Prone if able, currently unstable  Family updated on worsening condition  Roselie Awkward, MD [place] [facility] Pager: [phone] Cell: [phone] If no response, call [phone]

## 2019-05-18 NOTE — Progress Notes (Signed)
Sedation slowly weaned throughout shift d/t RASS -5, however increase in HR noted (70s at start of shift, 110s now) as well as increase in agonal respirations. Versed bolus given and rate increased from 1.5 to 2 mg/hr. Pt now grimaces to pain, RASS at goal of -4

## 2019-05-18 NOTE — Progress Notes (Signed)
ANTICOAGULATION CONSULT NOTE - Initial Consult  Pharmacy Consult for heparin Indication: r/o PE   Allergies  Allergen Reactions  . Cefuroxime Rash  . Sulfamethoxazole-Trimethoprim Itching  . Aloe     itch  . Cephalosporins Rash    Patient Measurements: Height: 4\' 11"  (149.9 cm) Weight: 175 lb 14.8 oz (79.8 kg) IBW/kg (Calculated) : 43.2 Heparin Dosing Weight: 60 kg   Vital Signs: Temp: 99.1 F (37.3 C) (11/23 1315) Temp Source: Esophageal (11/23 0800) BP: 108/70 (11/23 1300) Pulse Rate: 142 (11/23 1315)  Labs: Recent Labs    05/21/2019 1347  05/19/2019 1935 05/16/19 0400  05/17/19 0426  05/18/19 0421 05/18/19 0434 05/18/19 1315  HGB  --    < >  --  10.9*   < > 10.5*   < > 10.9* 9.8* 11.9*  HCT  --    < >  --  37.8   < > 36.5   < > 32.0* 34.8* 35.0*  PLT  --   --   --  326  --  263  --   --  185  --   CREATININE 0.71  --   --  0.72  --  0.66  --   --  0.46  --   TROPONINIHS 11  --  10  --   --   --   --   --   --   --    < > = values in this interval not displayed.    Estimated Creatinine Clearance: 63.1 mL/min (by C-G formula based on SCr of 0.46 mg/dL).   Medical History: Past Medical History:  Diagnosis Date  . Asthma   . Cataract   . COPD (chronic obstructive pulmonary disease) ([place])   . DM (diabetes mellitus), type 2 ([place])   . GERD (gastroesophageal reflux disease)   . HTN (hypertension)   . Neuropathy     Medications:  Medications Prior to Admission  Medication Sig Dispense Refill Last Dose  . ADVAIR DISKUS 250-50 MCG/DOSE AEPB Inhale 1 puff into the lungs 2 (two) times daily.   05/04/2019  . atorvastatin (LIPITOR) 10 MG tablet Take 10 mg by mouth daily.   05/04/2019  . estradiol (ESTRACE) 0.5 MG tablet Take 0.5 mg by mouth daily.   05/04/2019  . FARXIGA 10 MG TABS tablet Take 10 mg by mouth daily.   05/04/2019  . gabapentin (NEURONTIN) 300 MG capsule Take 300 mg by mouth 2 (two) times daily.   05/04/2019  . glipiZIDE (GLUCOTROL XL) 10 MG 24 hr tablet  Take 10 mg by mouth every morning.   05/04/2019  . losartan-hydrochlorothiazide (HYZAAR) 50-12.5 MG tablet Take 1 tablet by mouth daily.   05/04/2019  . metFORMIN (GLUCOPHAGE) 500 MG tablet Take 1,000 mg by mouth 2 (two) times daily.   05/04/2019  . Multiple Vitamins-Minerals (CENTRUM ADULTS) TABS Take 1 tablet by mouth daily. senior   unk  . naproxen sodium (ALEVE) 220 MG tablet Take 220 mg by mouth daily as needed (pain).   unk  . oxyCODONE-acetaminophen (PERCOCET) 10-325 MG tablet Take 1 tablet by mouth every 8 (eight) hours as needed.   unk  . pantoprazole (PROTONIX) 40 MG tablet Take 40 mg by mouth daily.   05/04/2019  . potassium chloride (KLOR-CON) 10 MEQ tablet Take 10 mEq by mouth 2 (two) times daily as needed.   unk  . TRESIBA FLEXTOUCH 200 UNIT/ML SOPN Inject 25 Units into the skin daily. morning   05/04/2019  . VENTOLIN HFA  108 (90 Base) MCG/ACT inhaler Inhale 2 puffs into the lungs every 4 (four) hours as needed for wheezing or shortness of breath.   unk    Assessment: 39 YOF admitted with COVID-19 pneumonia. Currently with worsening hypoxemia and concern for PE. Pharmacy consulted to start IV heparin. Last dose of Lovenox was this AM at 0826. H/H improved. Plt wnl. SCr wnl  Goal of Therapy:  Heparin level 0.3-0.7 units/ml Monitor platelets by anticoagulation protocol: Yes   Plan:  -SQ Lovenox d/c'ed  -Heparin 2000 units IV bolus, then start IV heparin infusion at 1000 units/hr -F/u 6 hr HL -Monitor daily HL, CBC and s/s of bleeding   Albertina Parr, PharmD., BCPS Clinical Pharmacist Clinical phone for 05/18/19 until 5pm: [phone]

## 2019-05-18 NOTE — Progress Notes (Signed)
  Echocardiogram 2D Echocardiogram has been performed.  Teresa Barker 05/18/2019, 1:58 PM

## 2019-05-18 NOTE — Procedures (Signed)
PCCM Video Bronchoscopy Procedure Note  The patient was informed of the risks (including but not limited to bleeding, infection, respiratory failure, lung injury, tooth/oral injury) and benefits of the procedure and gave consent, see chart.  Indication: severe acute respiratory failure with hypoxemia  Post Procedure Diagnosis: same  Location: [facility] ICU  Condition pre procedure: critically ill, on ventilator  Medications for procedure: vecuronium infusion, versed infusion, fentanyl infusion  Procedure description: The bronchoscope was introduced through the endotracheal tube and passed to the bilateral lungs to the level of the subsegmental bronchi throughout the tracheobronchial tree.  Airway exam revealed normal airways, no mucus plugging, no airway lesions.  Procedures performed: none  Specimens sent: none  Condition post procedure: critically ill, on vent  EBL: none  Complications: none  Roselie Awkward, MD [facility] Pager: [phone] Cell: [phone] If no response, call [phone]

## 2019-05-19 ENCOUNTER — Inpatient Hospital Stay (HOSPITAL_COMMUNITY): Payer: Medicare HMO

## 2019-05-19 DIAGNOSIS — U071 COVID-19: Secondary | ICD-10-CM | POA: Diagnosis not present

## 2019-05-19 DIAGNOSIS — J9600 Acute respiratory failure, unspecified whether with hypoxia or hypercapnia: Secondary | ICD-10-CM | POA: Diagnosis not present

## 2019-05-19 DIAGNOSIS — J1289 Other viral pneumonia: Secondary | ICD-10-CM | POA: Diagnosis not present

## 2019-05-19 DIAGNOSIS — J439 Emphysema, unspecified: Secondary | ICD-10-CM | POA: Diagnosis not present

## 2019-05-19 LAB — COMPREHENSIVE METABOLIC PANEL
ALT: 58 U/L — ABNORMAL HIGH (ref 0–44)
AST: 35 U/L (ref 15–41)
Albumin: 1.8 g/dL — ABNORMAL LOW (ref 3.5–5.0)
Alkaline Phosphatase: 113 U/L (ref 38–126)
Anion gap: 5 (ref 5–15)
BUN: 28 mg/dL — ABNORMAL HIGH (ref 8–23)
CO2: 32 mmol/L (ref 22–32)
Calcium: 8.3 mg/dL — ABNORMAL LOW (ref 8.9–10.3)
Chloride: 106 mmol/L (ref 98–111)
Creatinine, Ser: 0.52 mg/dL (ref 0.44–1.00)
GFR calc Af Amer: >60 mL/min (ref >60)
GFR calc non Af Amer: >60 mL/min (ref >60)
Glucose, Bld: 204 mg/dL — ABNORMAL HIGH (ref 70–99)
Potassium: 4.6 mmol/L (ref 3.5–5.1)
Sodium: 143 mmol/L (ref 135–145)
Total Bilirubin: 1.4 mg/dL — ABNORMAL HIGH (ref 0.3–1.2)
Total Protein: 5.1 g/dL — ABNORMAL LOW (ref 6.5–8.1)

## 2019-05-19 LAB — CBC WITH DIFFERENTIAL/PLATELET
Abs Immature Granulocytes: 0.35 10*3/uL — ABNORMAL HIGH (ref 0.00–0.07)
Basophils Absolute: 0 10*3/uL (ref 0.0–0.1)
Basophils Relative: 0 %
Eosinophils Absolute: 0 10*3/uL (ref 0.0–0.5)
Eosinophils Relative: 0 %
HCT: 30.2 % — ABNORMAL LOW (ref 36.0–46.0)
Hemoglobin: 8.6 g/dL — ABNORMAL LOW (ref 12.0–15.0)
Immature Granulocytes: 2 %
Lymphocytes Relative: 5 %
Lymphs Abs: 0.9 10*3/uL (ref 0.7–4.0)
MCH: 26.2 pg (ref 26.0–34.0)
MCHC: 28.5 g/dL — ABNORMAL LOW (ref 30.0–36.0)
MCV: 92.1 fL (ref 80.0–100.0)
Monocytes Absolute: 0.9 10*3/uL (ref 0.1–1.0)
Monocytes Relative: 5 %
Neutro Abs: 15.5 10*3/uL — ABNORMAL HIGH (ref 1.7–7.7)
Neutrophils Relative %: 88 %
Platelets: 167 10*3/uL (ref 150–400)
RBC: 3.28 MIL/uL — ABNORMAL LOW (ref 3.87–5.11)
RDW: 17 % — ABNORMAL HIGH (ref 11.5–15.5)
WBC: 17.7 10*3/uL — ABNORMAL HIGH (ref 4.0–10.5)
nRBC: 0 % (ref 0.0–0.2)

## 2019-05-19 LAB — POCT I-STAT 7, (LYTES, BLD GAS, ICA,H+H)
Acid-Base Excess: 9 mmol/L — ABNORMAL HIGH (ref 0.0–2.0)
Acid-Base Excess: 10 mmol/L — ABNORMAL HIGH (ref 0.0–2.0)
Bicarbonate: 35.3 mmol/L — ABNORMAL HIGH (ref 20.0–28.0)
Bicarbonate: 37.2 mmol/L — ABNORMAL HIGH (ref 20.0–28.0)
Calcium, Ion: 1.28 mmol/L (ref 1.15–1.40)
Calcium, Ion: 1.28 mmol/L (ref 1.15–1.40)
HCT: 28 % — ABNORMAL LOW (ref 36.0–46.0)
HCT: 28 % — ABNORMAL LOW (ref 36.0–46.0)
Hemoglobin: 9.5 g/dL — ABNORMAL LOW (ref 12.0–15.0)
Hemoglobin: 9.5 g/dL — ABNORMAL LOW (ref 12.0–15.0)
O2 Saturation: 89 %
O2 Saturation: 89 %
Patient temperature: 36.3
Patient temperature: 36.4
Potassium: 4.6 mmol/L (ref 3.5–5.1)
Potassium: 4.5 mmol/L (ref 3.5–5.1)
Sodium: 145 mmol/L (ref 135–145)
Sodium: 144 mmol/L (ref 135–145)
TCO2: 37 mmol/L — ABNORMAL HIGH (ref 22–32)
TCO2: 39 mmol/L — ABNORMAL HIGH (ref 22–32)
pCO2 arterial: 59.1 mmHg — ABNORMAL HIGH (ref 32.0–48.0)
pCO2 arterial: 61.8 mmHg — ABNORMAL HIGH (ref 32.0–48.0)
pH, Arterial: 7.38 (ref 7.350–7.450)
pH, Arterial: 7.385 (ref 7.350–7.450)
pO2, Arterial: 57 mmHg — ABNORMAL LOW (ref 83.0–108.0)
pO2, Arterial: 58 mmHg — ABNORMAL LOW (ref 83.0–108.0)

## 2019-05-19 LAB — HEPARIN LEVEL (UNFRACTIONATED)
Heparin Unfractionated: 1.04 IU/mL — ABNORMAL HIGH (ref 0.30–0.70)
Heparin Unfractionated: 0.82 IU/mL — ABNORMAL HIGH (ref 0.30–0.70)
Heparin Unfractionated: 0.59 IU/mL (ref 0.30–0.70)

## 2019-05-19 LAB — D-DIMER, QUANTITATIVE: D-Dimer, Quant: 2.4 ug/mL-FEU — ABNORMAL HIGH (ref 0.00–0.50)

## 2019-05-19 LAB — GLUCOSE, CAPILLARY
Glucose-Capillary: 218 mg/dL — ABNORMAL HIGH (ref 70–99)
Glucose-Capillary: 190 mg/dL — ABNORMAL HIGH (ref 70–99)
Glucose-Capillary: 266 mg/dL — ABNORMAL HIGH (ref 70–99)
Glucose-Capillary: 227 mg/dL — ABNORMAL HIGH (ref 70–99)
Glucose-Capillary: 263 mg/dL — ABNORMAL HIGH (ref 70–99)
Glucose-Capillary: 268 mg/dL — ABNORMAL HIGH (ref 70–99)
Glucose-Capillary: 263 mg/dL — ABNORMAL HIGH (ref 70–99)

## 2019-05-19 LAB — C-REACTIVE PROTEIN: CRP: 31.5 mg/dL — ABNORMAL HIGH (ref <1.0)

## 2019-05-19 MED ORDER — VITAL AF 1.2 CAL PO LIQD
1000.0000 mL | ORAL | Status: DC
  Administered 2019-05-19 – 2019-05-26 (×5): 1000 mL

## 2019-05-19 MED ORDER — VECURONIUM BROMIDE 10 MG IV SOLR
0.1000 mg/kg | INTRAVENOUS | Status: DC | PRN
  Administered 2019-05-19 – 2019-05-25 (×9): 8 mg via INTRAVENOUS
  Filled 2019-05-19 (×11): qty 10

## 2019-05-19 MED ORDER — PRO-STAT SUGAR FREE PO LIQD
30.0000 mL | Freq: Three times a day (TID) | ORAL | Status: DC
  Administered 2019-05-19 – 2019-05-26 (×22): 30 mL
  Filled 2019-05-19 (×20): qty 30

## 2019-05-19 MED ORDER — HEPARIN (PORCINE) 25000 UT/250ML-% IV SOLN
1300.0000 [IU]/h | INTRAVENOUS | Status: DC
  Administered 2019-05-19 – 2019-05-21 (×2): 700 [IU]/h via INTRAVENOUS
  Administered 2019-05-23: 1300 [IU]/h via INTRAVENOUS
  Filled 2019-05-19 (×4): qty 250

## 2019-05-19 MED ORDER — INSULIN ASPART 100 UNIT/ML ~~LOC~~ SOLN
0.0000 [IU] | SUBCUTANEOUS | Status: DC
  Administered 2019-05-19: 8 [IU] via SUBCUTANEOUS
  Administered 2019-05-19: 12:00:00 5 [IU] via SUBCUTANEOUS
  Administered 2019-05-19 (×2): 8 [IU] via SUBCUTANEOUS
  Administered 2019-05-20: 3 [IU] via SUBCUTANEOUS
  Administered 2019-05-20 – 2019-05-21 (×6): 8 [IU] via SUBCUTANEOUS
  Administered 2019-05-21: 3 [IU] via SUBCUTANEOUS
  Administered 2019-05-21: 5 [IU] via SUBCUTANEOUS
  Administered 2019-05-21: 2 [IU] via SUBCUTANEOUS
  Administered 2019-05-22: 11 [IU] via SUBCUTANEOUS
  Administered 2019-05-22: 3 [IU] via SUBCUTANEOUS
  Administered 2019-05-22: 8 [IU] via SUBCUTANEOUS
  Administered 2019-05-22: 11 [IU] via SUBCUTANEOUS
  Administered 2019-05-22: 8 [IU] via SUBCUTANEOUS
  Administered 2019-05-23: 5 [IU] via SUBCUTANEOUS
  Administered 2019-05-23: 3 [IU] via SUBCUTANEOUS
  Administered 2019-05-23: 5 [IU] via SUBCUTANEOUS
  Administered 2019-05-24 (×2): 3 [IU] via SUBCUTANEOUS
  Administered 2019-05-24: 11 [IU] via SUBCUTANEOUS
  Administered 2019-05-24: 2 [IU] via SUBCUTANEOUS
  Administered 2019-05-24: 11 [IU] via SUBCUTANEOUS
  Administered 2019-05-25 (×2): 2 [IU] via SUBCUTANEOUS
  Administered 2019-05-25 (×2): 3 [IU] via SUBCUTANEOUS
  Administered 2019-05-26 (×2): 5 [IU] via SUBCUTANEOUS
  Administered 2019-05-26: 3 [IU] via SUBCUTANEOUS
  Administered 2019-05-26: 09:00:00 2 [IU] via SUBCUTANEOUS
  Administered 2019-05-27: 17:00:00 3 [IU] via SUBCUTANEOUS
  Administered 2019-05-27: 8 [IU] via SUBCUTANEOUS
  Administered 2019-05-27: 21:00:00 3 [IU] via SUBCUTANEOUS
  Administered 2019-05-27: 12:00:00 5 [IU] via SUBCUTANEOUS
  Administered 2019-05-27 – 2019-05-28 (×4): 3 [IU] via SUBCUTANEOUS
  Administered 2019-05-28 (×2): 5 [IU] via SUBCUTANEOUS
  Administered 2019-05-28: 3 [IU] via SUBCUTANEOUS
  Administered 2019-05-28 – 2019-05-29 (×3): 5 [IU] via SUBCUTANEOUS
  Administered 2019-05-29: 3 [IU] via SUBCUTANEOUS
  Administered 2019-05-29 (×3): 5 [IU] via SUBCUTANEOUS
  Administered 2019-05-29: 3 [IU] via SUBCUTANEOUS
  Administered 2019-05-30 (×2): 8 [IU] via SUBCUTANEOUS
  Administered 2019-05-30: 04:00:00 5 [IU] via SUBCUTANEOUS

## 2019-05-19 NOTE — Progress Notes (Signed)
NAME:  Teresa Barker, MRN:  [id number], DOB:  [birth year], LOS: 4 ADMISSION DATE:  05/08/2019, CONSULTATION DATE:  11/20 REFERRING MD:  Derrill Kay MD, CHIEF COMPLAINT:  COVID 19 pneumonia   Brief History   68 y/o female admitted 11/10 to [facility] for COVID pneumonia, intubated [facility] on 11/19, moved to [facility] ICU on 11/20.    Past Medical History  COPD DM2 Obesity Asthma GERD Hypertension Neuropathy  Significant Hospital Events   11/8- Admit to [facility] 11/19- Intubated, transfer to [facility] 11/21- New onset afib on admission > converted to NSR 11/22 pneumomediastinum and pneumopericardium seen on CT chest 11/23 Prone   Consults:   Procedures:  Femoral triple-lumen 11/19 outside hospital > 11/21 Rt PICC 11/21 >>  11/23>Bronch normal airways, no mucus plugging, no airway lesions.  Significant Diagnostic Tests:  11/22 CT chest images personally reviewed: pneumomediastinum and pneumopericardium noted, small bilateral pneumothoraces. Bilateral peripheral predominant ground glass opacification. 11/23 Echo EF 65 to 70%, LV hyperdynamic function.  Borderline LVH.  Indeterminate diastolic filling due to E-A fusion. Global RV systolic function normal.  RV systolic pressure 99991111 mmHg, moderately elevated. Trace MR. Mild TR. Mild calcification of AV. Moderately elevated pulmonary artery systolic pressure. Micro Data:  SARS COV2 11/7 at [facility]- Positive  Antimicrobials:  Completed remdesivir at [facility]  Interim history/subjective:  Proned overnight.  Supined this am.   Objective   Blood pressure 106/66, pulse 88, temperature (!) 97.2 F (36.2 C), resp. rate (!) 35, height 4\' 11"  (1.499 m), weight 79.8 kg, SpO2 93 %.    Vent Mode: PRVC FiO2 (%):  [90 %-100 %] 90 % Set Rate:  [35 bmp] 35 bmp Vt Set:  [250 mL-300 mL] 300 mL PEEP:  [12 cmH20] 12 cmH20 Plateau Pressure:  [29 cmH20-39 cmH20] 34 cmH20   Intake/Output Summary (Last 24 hours) at 05/19/2019 0944 Last data  filed at 05/19/2019 0700 Gross per 24 hour  Intake 1838.52 ml  Output 1400 ml  Net 438.52 ml   Filed Weights   05/16/19 0500 05/17/19 0400 05/18/19 0500  Weight: 74.6 kg 78.1 kg 79.8 kg    Examination:  General: Obese, elderly, critically ill-appearing female.  Sedated and chemically paralyzed HENT: Normocephalic, PERRL.  Neck: No JVD. Trachea midline.  CV: RRR. S1S2. No MRG. +2 distal pulses Lungs: BBS significantly diminished at bases, shallow, mild rhonchi, vent supported.  Low VT ABD: Multiple pani, large girth, +BS x4. SNT/ND. No masses, guarding or rigidity GU: Foley EXT: Generalized edema Skin: Small ulceration noted to inside of upper lip.  Pressure dressings at points of contact for proning position Neuro: Chemically sedated and paralyzed   11/24 PA CXR personally reviewed persistent diffuse b/l pulm infiltrates Resolved Hospital Problem list     Assessment & Plan:  ARDS: worsening hypoxemia, severe ventilator dyssynchrony Started on continuous paralytics 11/23 Continue mechanical ventilation per ARDS protocol Target TVol 6-8cc/kgIBW Target Plateau Pressure < 30cm H20 Target driving pressure less than 15 cm of water Target PaO2 55-65: titrate PEEP/FiO2 per protocol As long as PaO2 to FiO2 ratio is less than 1:150 position in prone position for 16 hours a day Check CVP daily if CVL in place Target CVP less than 4, diurese as necessary Ventilator associated pneumonia prevention protocol Continue proning protocol Attempt intermittent paralytic Continue steroids Initiated heparin infusion 11/23 d/t worsening hypoxemia in the setting of Covid, high risk for PE-echo reassuring for no RV strain.  Sedation needs for ventilator synchrony Continue RASS target -5 while paralyzed  Versed infusion Fentanyl infusion Intermittent paralysis as noted above Continue oxycodone and clonazepam  Pneumomediastinum with pneumopericardium and small bilateral pneumothoraces CXR  daily-we will attempt to obtain same after patient returned to supine position in a.m. Monitor closely  Diabetes mellitus with hyperglycemia placated by steroids Continue SSI Continue detemir insulin Continue linagliptin  Atrial Fib-currently remaining in sinus rhythm with PACs and PVCs Tele   Best practice:  Diet: tube feeding Pain/Anxiety/Delirium protocol (if indicated): as above VAP protocol (if indicated): yes DVT prophylaxis: Heparin infusion GI prophylaxis: famotidine Glucose control: SSI Mobility: bed rest Code Status: DNR if cardiac arrest Family Communication: Updated daughter [name] and son in Pharmacologist by phone.  Questions answered to their satisfaction. Disposition: remain in ICU  Labs   CBC: Recent Labs  Lab 05/16/19 0400  05/17/19 0426  05/18/19 0434 05/18/19 1305 05/18/19 1315 05/18/19 1658 05/19/19 0600 05/19/19 0612  WBC 27.9*  --  25.6*  --  17.8* 24.3*  --   --   --  17.7*  NEUTROABS 24.1*  --  20.8*  --  15.5* 22.0*  --   --   --  15.5*  HGB 10.9*   < > 10.5*   < > 9.8* 10.4* 11.9* 9.9* 9.5* 8.6*  HCT 37.8   < > 36.5   < > 34.8* 36.7 35.0* 29.0* 28.0* 30.2*  MCV 90.0  --  91.0  --  91.6 93.6  --   --   --  92.1  PLT 326  --  263  --  185 189  --   --   --  167   < > = values in this interval not displayed.    Basic Metabolic Panel: Recent Labs  Lab 05/23/2019 0520  05/11/2019 1347  05/16/19 0400  05/17/19 0426  05/18/19 0434 05/18/19 1315 05/18/19 1658 05/19/19 0600 05/19/19 0612  NA  --    < > 140   < > 142   < > 144   < > 145 140 139 145 143  K  --    < > 4.1   < > 3.2*   < > 4.1   < > 4.6 5.7* 5.7* 4.6 4.6  CL  --   --  105  --  107  --  109  --  108  --   --   --  106  CO2  --   --  26  --  27  --  28  --  31  --   --   --  32  GLUCOSE  --   --  244*  --  114*  --  157*  --  190*  --   --   --  204*  BUN  --   --  18  --  21  --  23  --  19  --   --   --  28*  CREATININE 0.63  --  0.71  --  0.72  --  0.66  --  0.46  --   --   --  0.52   CALCIUM  --   --  7.7*  --  8.0*  --  8.1*  --  8.3*  --   --   --  8.3*  MG 2.0  --   --   --  2.3  --  2.2  --  2.0  --   --   --   --   PHOS  --   --   --   --  3.1  --  2.4*  --  1.2*  --   --   --   --    < > = values in this interval not displayed.   GFR: Estimated Creatinine Clearance: 63.1 mL/min (by C-G formula based on SCr of 0.52 mg/dL). Recent Labs  Lab 04/29/2019 0520 05/22/2019 1348  05/17/19 0426 05/18/19 0434 05/18/19 1305 05/19/19 0612  PROCALCITON <0.10  --   --   --   --   --   --   WBC 14.7*  --    < > 25.6* 17.8* 24.3* 17.7*  LATICACIDVEN  --  2.1*  --   --   --   --   --    < > = values in this interval not displayed.    Liver Function Tests: Recent Labs  Lab 05/21/2019 1347 05/16/19 0400 05/17/19 0426 05/18/19 0434 05/19/19 0612  AST 65* 68* 48* 48* 35  ALT 76* 91* 93* 76* 58*  ALKPHOS 160* 138* 127* 139* 113  BILITOT 0.8 1.2 0.6 1.3* 1.4*  PROT 5.9* 5.3* 5.4* 5.2* 5.1*  ALBUMIN 2.3* 2.2* 2.1* 1.8* 1.8*   No results for input(s): LIPASE, AMYLASE in the last 168 hours. No results for input(s): AMMONIA in the last 168 hours.  ABG    Component Value Date/Time   PHART 7.380 05/19/2019 0600   PCO2ART 59.1 (H) 05/19/2019 0600   PO2ART 57.0 (L) 05/19/2019 0600   HCO3 35.3 (H) 05/19/2019 0600   TCO2 37 (H) 05/19/2019 0600   ACIDBASEDEF 1.0 04/26/2019 1149   O2SAT 89.0 05/19/2019 0600      HbA1C: Hgb A1c MFr Bld  Date/Time Value Ref Range Status  05/12/2019 05:20 AM 9.8 (H) 4.8 - 5.6 % Final    Comment:    (NOTE) Pre diabetes:          5.7%-6.4% Diabetes:              >6.4% Glycemic control for   <7.0% adults with diabetes     CBG: Recent Labs  Lab 05/18/19 1158 05/18/19 1538 05/18/19 1948 05/19/19 0325 05/19/19 0759  GLUCAP 243* 294* 286* 218* 190*      The patient is critically ill with ARDS/respiratory failure. She requires ongoing ICU for high complexity decision making, titration of high alert medications, ventilator  management, titration of oxygen and interpretation of advanced monitoring.    I personally spent 60 minutes providing critical care services including personally reviewing test results, discussing care with nursing staff/other physicians and completing orders pertaining to this patient.  Time was exclusive to the patient and does not include time spent teaching or in procedures.  Voice recognition software was used in the production of this record.  Errors in interpretation may have been inadvertently missed during review.  Francine Graven, MSN, AGACNP  [facility]

## 2019-05-19 NOTE — Progress Notes (Signed)
Patient placed in supine position without complications by RT x 2 and RN x 4.  ETT secured with a commercial tube holder.  Patient has edema to her upper lip with a pressure ulcer.  RN notified.

## 2019-05-19 NOTE — Progress Notes (Signed)
ANTICOAGULATION CONSULT NOTE - Follow Up Consult  Pharmacy Consult for heparin Indication: r/o PE   Allergies  Allergen Reactions  . Cefuroxime Rash  . Sulfamethoxazole-Trimethoprim Itching  . Aloe     itch  . Cephalosporins Rash    Patient Measurements: Height: 4\' 11"  (149.9 cm) Weight: 175 lb 14.8 oz (79.8 kg) IBW/kg (Calculated) : 43.2 Heparin Dosing Weight: 60 kg   Vital Signs: Temp: 97.2 F (36.2 C) (11/24 0700) Temp Source: Esophageal (11/24 0400) BP: 106/66 (11/24 0813) Pulse Rate: 88 (11/24 0909)  Labs: Recent Labs    05/17/19 0426  05/18/19 0434 05/18/19 1305  05/18/19 1658 05/18/19 2115 05/19/19 0600 05/19/19 0612  HGB 10.5*   < > 9.8* 10.4*   < > 9.9*  --  9.5* 8.6*  HCT 36.5   < > 34.8* 36.7   < > 29.0*  --  28.0* 30.2*  PLT 263  --  185 189  --   --   --   --  167  HEPARINUNFRC  --   --   --   --   --   --  1.04*  --   --   CREATININE 0.66  --  0.46  --   --   --   --   --  0.52   < > = values in this interval not displayed.    Estimated Creatinine Clearance: 63.1 mL/min (by C-G formula based on SCr of 0.52 mg/dL).  Assessment: 40 YOF admitted with COVID-19 pneumonia. Currently with worsening hypoxemia and concern for PE. Pharmacy consulted to start IV heparin.   HL remains slightly supratherapeutic this AM on 800 units/hr of IV heparin. H/H improved. Plt wnl but slowly trending down.   Goal of Therapy:  Heparin level 0.3-0.7 units/ml Monitor platelets by anticoagulation protocol: Yes     Plan:  Decrease IV heparin to 700 units/hr  Re-check HL in 6 hours Monitor daily HL, CBC and s/s of bleeding   Albertina Parr, PharmD., BCPS Clinical Pharmacist Clinical phone for 05/19/19 until 5pm: [phone]

## 2019-05-19 NOTE — Progress Notes (Signed)
Nutrition Follow-up RD working remotely.  DOCUMENTATION CODES:   Obesity unspecified  INTERVENTION:   Change TF regimen to better meet re-estimated needs:   Vital AF 1.2 at 40 ml/h (960 ml per day)   Pro-stat 30 ml TID   Provides 1452 kcal, 117 gm protein, 779 ml free water daily  NUTRITION DIAGNOSIS:   Increased nutrient needs related to acute illness(COVID PNA) as evidenced by estimated needs.  Ongoing  GOAL:   Patient will meet greater than or equal to 90% of their needs  Met with TF  MONITOR:   TF tolerance, Vent status, Labs, Skin  ASSESSMENT:   68 yo female admitted with COVID 19 PNA. PMH includes COPD, DM, obesity, asthma, GERD, HTN, neuropathy.   S/P bronchoscopy 11/23, then proned until this morning. Upper lip edema and pressure ulcer noted by RT.  Patient remains intubated on ventilator support MV: 10.8 L/min Temp (24hrs), Avg:97.6 F (36.4 C), Min:96.1 F (35.6 C), Max:99.1 F (37.3 C)  Levophed and propofol have been stopped.  OG tube in place. Currently receiving Vital High Protein at 20 ml/h with Pro-stat 30 ml QID. Tolerating well. Not meeting kcal needs now that propofol is off.   Labs reviewed.  CBG's: 190-227  Medications reviewed and include decadron, novolog, levemir, tradjenta, vitamin C, zinc sulfate.   Admission weight 78.8 kg, up to 79.8 kg today. I/O +1.6 L  Diet Order:   Diet Order            Diet NPO time specified  Diet effective now              EDUCATION NEEDS:   Not appropriate for education at this time  Skin:  Skin Assessment: Skin Integrity Issues: Skin Integrity Issues:: Other (Comment) Other: pressure injury to upper lip from proning  Last BM:  11/24  Height:   Ht Readings from Last 1 Encounters:  05/18/19 4' 11" (1.499 m)    Weight:   Wt Readings from Last 1 Encounters:  05/18/19 79.8 kg    Ideal Body Weight:  44.7 kg  BMI:  Body mass index is 35.53 kg/m.  Estimated Nutritional  Needs:   Kcal:  1400-1650  Protein:  >/= 89 gm  Fluid:  >/= 1.5 L    Kimberly Harris, RD, LDN, CNSC Pager [phone] After Hours Pager [phone]  

## 2019-05-19 NOTE — Progress Notes (Addendum)
ANTICOAGULATION CONSULT NOTE - Initial Consult  Pharmacy Consult for heparin Indication: r/o PE   Allergies  Allergen Reactions  . Cefuroxime Rash  . Sulfamethoxazole-Trimethoprim Itching  . Aloe     itch  . Cephalosporins Rash    Patient Measurements: Height: 4\' 11"  (149.9 cm) Weight: 175 lb 14.8 oz (79.8 kg) IBW/kg (Calculated) : 43.2 Heparin Dosing Weight: 60 kg   Vital Signs: Temp: 97 F (36.1 C) (11/24 0030) Temp Source: Esophageal (11/24 0000) BP: 101/65 (11/24 0030) Pulse Rate: 92 (11/24 0030)  Labs: Recent Labs    05/16/19 0400  05/17/19 0426  05/18/19 0434 05/18/19 1305 05/18/19 1315 05/18/19 1658 05/18/19 2115  HGB 10.9*   < > 10.5*   < > 9.8* 10.4* 11.9* 9.9*  --   HCT 37.8   < > 36.5   < > 34.8* 36.7 35.0* 29.0*  --   PLT 326  --  263  --  185 189  --   --   --   HEPARINUNFRC  --   --   --   --   --   --   --   --  1.04*  CREATININE 0.72  --  0.66  --  0.46  --   --   --   --    < > = values in this interval not displayed.    Estimated Creatinine Clearance: 63.1 mL/min (by C-G formula based on SCr of 0.46 mg/dL).  Assessment: 59 YOF admitted with COVID-19 pneumonia. Currently with worsening hypoxemia and concern for PE. Pharmacy consulted to start IV heparin. Last dose of Lovenox was this AM at 0826. H/H improved. Plt wnl. SCr wnl  Goal of Therapy:  Heparin level 0.3-0.7 units/ml Monitor platelets by anticoagulation protocol: Yes    11/24/ 20  0019 UPDATE Spoke with [facility]  and HL is 1.24, so dilution required Dilutional HL= 1.04--> above therapeutic goal range No signs or symptoms of bleeding observed by RN   Plan:  Hold  heparin infusion x1 hour Re-start heparin infusion at 800 units/hr at 0140 Re-check HL in 6-8 hours Monitor daily HL, CBC and s/s of bleeding   Despina Pole, Pharm. D. Clinical Pharmacist 05/19/2019 12:43 AM

## 2019-05-19 NOTE — Progress Notes (Signed)
ANTICOAGULATION CONSULT NOTE - Follow Up Consult  Pharmacy Consult for heparin Indication: r/o PE   Allergies  Allergen Reactions  . Cefuroxime Rash  . Sulfamethoxazole-Trimethoprim Itching  . Aloe     itch  . Cephalosporins Rash    Patient Measurements: Height: 4\' 11"  (149.9 cm) Weight: 175 lb 14.8 oz (79.8 kg) IBW/kg (Calculated) : 43.2 Heparin Dosing Weight: 60 kg   Vital Signs: Temp: 97.5 F (36.4 C) (11/24 1600) Temp Source: Axillary (11/24 1600) BP: 141/80 (11/24 1900) Pulse Rate: 90 (11/24 1900)  Labs: Recent Labs    05/17/19 0426  05/18/19 0434 05/18/19 1305  05/18/19 2115 05/19/19 0600 05/19/19 0612 05/19/19 1000 05/19/19 1035 05/19/19 1800  HGB 10.5*   < > 9.8* 10.4*   < >  --  9.5* 8.6*  --  9.5*  --   HCT 36.5   < > 34.8* 36.7   < >  --  28.0* 30.2*  --  28.0*  --   PLT 263  --  185 189  --   --   --  167  --   --   --   HEPARINUNFRC  --   --   --   --   --  1.04*  --   --  0.82*  --  0.59  CREATININE 0.66  --  0.46  --   --   --   --  0.52  --   --   --    < > = values in this interval not displayed.    Estimated Creatinine Clearance: 63.1 mL/min (by C-G formula based on SCr of 0.52 mg/dL).  Assessment: 39 YOF admitted with COVID-19 pneumonia. Currently with worsening hypoxemia and concern for PE. Pharmacy consulted to start IV heparin.   HL remains slightly supratherapeutic this AM on 800 units/hr of IV heparin. H/H improved. Plt wnl but slowly trending down.   Heparin level came back in goal tonight. We will repeat a level in AM.   Goal of Therapy:  Heparin level 0.3-0.7 units/ml Monitor platelets by anticoagulation protocol: Yes     Plan:  Cont IV heparin to 700 units/hr  Monitor daily HL, CBC and s/s of bleeding   Onnie Boer, PharmD, BCIDP, AAHIVP, CPP Infectious Disease Pharmacist 05/19/2019 7:56 PM

## 2019-05-19 NOTE — Discharge Instructions (Signed)
Acute Respiratory Failure, Adult ° °Acute respiratory failure occurs when there is not enough oxygen passing from your lungs to your body. When this happens, your lungs have trouble removing carbon dioxide from the blood. This causes your blood oxygen level to drop too low as carbon dioxide builds up. °Acute respiratory failure is a medical emergency. It can develop quickly, but it is temporary if treated promptly. Your lung capacity, or how much air your lungs can hold, may improve with time, exercise, and treatment. °What are the causes? °There are many possible causes of acute respiratory failure, including: °· Lung injury. °· Chest injury or damage to the ribs or tissues near the lungs. °· Lung conditions that affect the flow of air and blood into and out of the lungs, such as pneumonia, acute respiratory distress syndrome, and cystic fibrosis. °· Medical conditions, such as strokes or spinal cord injuries, that affect the muscles and nerves that control breathing. °· Blood infection (sepsis). °· Inflammation of the pancreas (pancreatitis). °· A blood clot in the lungs (pulmonary embolism). °· A large-volume blood transfusion. °· Burns. °· Near-drowning. °· Seizure. °· Smoke inhalation. °· Reaction to medicines. °· Alcohol or drug overdose. °What increases the risk? °This condition is more likely to develop in people who have: °· A blocked airway. °· Asthma. °· A condition or disease that damages or weakens the muscles, nerves, bones, or tissues that are involved in breathing. °· A serious infection. °· A health problem that blocks the unconscious reflex that is involved in breathing, such as hypothyroidism or sleep apnea. °· A lung injury or trauma. °What are the signs or symptoms? °Trouble breathing is the main symptom of acute respiratory failure. Symptoms may also include: °· Rapid breathing. °· Restlessness or anxiety. °· Skin, lips, or fingernails that appear blue (cyanosis). °· Rapid heart  rate. °· Abnormal heart rhythms (arrhythmias). °· Confusion or changes in behavior. °· Tiredness or loss of energy. °· Feeling sleepy or having a loss of consciousness. °How is this diagnosed? °Your health care provider can diagnose acute respiratory failure with a medical history and physical exam. During the exam, your health care provider will listen to your heart and check for crackling or wheezing sounds in your lungs. Your may also have tests to confirm the diagnosis and determine what is causing respiratory failure. These tests may include: °· Measuring the amount of oxygen in your blood (pulse oximetry). The measurement comes from a small device that is placed on your finger, earlobe, or toe. °· Other blood tests to measure blood gases and to look for signs of infection. °· Sampling your cerebral spinal fluid or tracheal fluid to check for infections. °· Chest X-ray to look for fluid in spaces that should be filled with air. °· Electrocardiogram (ECG) to look at the heart's electrical activity. °How is this treated? °Treatment for this condition usually takes places in a hospital intensive care unit (ICU). Treatment depends on what is causing the condition. It may include one or more treatments until your symptoms improve. Treatment may include: °· Supplemental oxygen. Extra oxygen is given through a tube in the nose, a face mask, or a hood. °· A device such as a continuous positive airway pressure (CPAP) or bi-level positive airway pressure (BiPAP or BPAP) machine. This treatment uses mild air pressure to keep the airways open. A mask or other device will be placed over your nose or mouth. A tube that is connected to a motor will deliver oxygen through the   mask. °· Ventilator. This treatment helps move air into and out of the lungs. This may be done with a bag and mask or a machine. For this treatment, a tube is placed in your windpipe (trachea) so air and oxygen can flow to the lungs. °· Extracorporeal  membrane oxygenation (ECMO). This treatment temporarily takes over the function of the heart and lungs, supplying oxygen and removing carbon dioxide. ECMO gives the lungs a chance to recover. It may be used if a ventilator is not effective. °· Tracheostomy. This is a procedure that creates a hole in the neck to insert a breathing tube. °· Receiving fluids and medicines. °· Rocking the bed to help breathing. °Follow these instructions at home: °· Take over-the-counter and prescription medicines only as told by your health care provider. °· Return to normal activities as told by your health care provider. Ask your health care provider what activities are safe for you. °· Keep all follow-up visits as told by your health care provider. This is important. °How is this prevented? °Treating infections and medical conditions that may lead to acute respiratory failure can help prevent the condition from developing. °Contact a health care provider if: °· You have a fever. °· Your symptoms do not improve or they get worse. °Get help right away if: °· You are having trouble breathing. °· You lose consciousness. °· Your have cyanosis or turn blue. °· You develop a rapid heart rate. °· You are confused. °These symptoms may represent a serious problem that is an emergency. Do not wait to see if the symptoms will go away. Get medical help right away. Call your local emergency services (911 in the [place] Do not drive yourself to the hospital. °This information is not intended to replace advice given to you by your health care provider. Make sure you discuss any questions you have with your health care provider. °Document Released: 06/16/2013 Document Revised: 05/24/2017 Document Reviewed: 12/28/2015 °Elsevier Patient Education © 2020 [employer] ° °

## 2019-05-19 NOTE — Progress Notes (Signed)
RT removed commercial ETT holder and placed foam padding on the patient's cheeks, back of the neck, above the upper lip, and the chin.  ETT then secured with cloth tape at 23 on the Left.  Patient moved to prone bed and then placed in prone position by RT x 2 and RN x 4 without complications.

## 2019-05-20 ENCOUNTER — Inpatient Hospital Stay (HOSPITAL_COMMUNITY): Payer: Medicare HMO

## 2019-05-20 DIAGNOSIS — J9600 Acute respiratory failure, unspecified whether with hypoxia or hypercapnia: Secondary | ICD-10-CM | POA: Diagnosis not present

## 2019-05-20 DIAGNOSIS — J1289 Other viral pneumonia: Secondary | ICD-10-CM | POA: Diagnosis not present

## 2019-05-20 DIAGNOSIS — U071 COVID-19: Secondary | ICD-10-CM | POA: Diagnosis not present

## 2019-05-20 LAB — CBC WITH DIFFERENTIAL/PLATELET
Abs Immature Granulocytes: 0.77 10*3/uL — ABNORMAL HIGH (ref 0.00–0.07)
Basophils Absolute: 0.1 10*3/uL (ref 0.0–0.1)
Basophils Relative: 0 %
Eosinophils Absolute: 0 10*3/uL (ref 0.0–0.5)
Eosinophils Relative: 0 %
HCT: 30.2 % — ABNORMAL LOW (ref 36.0–46.0)
Hemoglobin: 8.6 g/dL — ABNORMAL LOW (ref 12.0–15.0)
Immature Granulocytes: 4 %
Lymphocytes Relative: 4 %
Lymphs Abs: 0.8 10*3/uL (ref 0.7–4.0)
MCH: 26.3 pg (ref 26.0–34.0)
MCHC: 28.5 g/dL — ABNORMAL LOW (ref 30.0–36.0)
MCV: 92.4 fL (ref 80.0–100.0)
Monocytes Absolute: 1.1 10*3/uL — ABNORMAL HIGH (ref 0.1–1.0)
Monocytes Relative: 6 %
Neutro Abs: 16 10*3/uL — ABNORMAL HIGH (ref 1.7–7.7)
Neutrophils Relative %: 86 %
Platelets: 177 10*3/uL (ref 150–400)
RBC: 3.27 MIL/uL — ABNORMAL LOW (ref 3.87–5.11)
RDW: 17.4 % — ABNORMAL HIGH (ref 11.5–15.5)
WBC: 18.7 10*3/uL — ABNORMAL HIGH (ref 4.0–10.5)
nRBC: 0.2 % (ref 0.0–0.2)

## 2019-05-20 LAB — COMPREHENSIVE METABOLIC PANEL
ALT: 52 U/L — ABNORMAL HIGH (ref 0–44)
AST: 30 U/L (ref 15–41)
Albumin: 1.8 g/dL — ABNORMAL LOW (ref 3.5–5.0)
Alkaline Phosphatase: 137 U/L — ABNORMAL HIGH (ref 38–126)
Anion gap: 6 (ref 5–15)
BUN: 35 mg/dL — ABNORMAL HIGH (ref 8–23)
CO2: 35 mmol/L — ABNORMAL HIGH (ref 22–32)
Calcium: 9.3 mg/dL (ref 8.9–10.3)
Chloride: 104 mmol/L (ref 98–111)
Creatinine, Ser: 0.5 mg/dL (ref 0.44–1.00)
GFR calc Af Amer: >60 mL/min (ref >60)
GFR calc non Af Amer: >60 mL/min (ref >60)
Glucose, Bld: 289 mg/dL — ABNORMAL HIGH (ref 70–99)
Potassium: 4.8 mmol/L (ref 3.5–5.1)
Sodium: 145 mmol/L (ref 135–145)
Total Bilirubin: 0.8 mg/dL (ref 0.3–1.2)
Total Protein: 5.7 g/dL — ABNORMAL LOW (ref 6.5–8.1)

## 2019-05-20 LAB — D-DIMER, QUANTITATIVE: D-Dimer, Quant: 1.73 ug/mL-FEU — ABNORMAL HIGH (ref 0.00–0.50)

## 2019-05-20 LAB — POCT I-STAT 7, (LYTES, BLD GAS, ICA,H+H)
Acid-Base Excess: 11 mmol/L — ABNORMAL HIGH (ref 0.0–2.0)
Bicarbonate: 39.6 mmol/L — ABNORMAL HIGH (ref 20.0–28.0)
Calcium, Ion: 1.34 mmol/L (ref 1.15–1.40)
HCT: 30 % — ABNORMAL LOW (ref 36.0–46.0)
Hemoglobin: 10.2 g/dL — ABNORMAL LOW (ref 12.0–15.0)
O2 Saturation: 93 %
Patient temperature: 37
Potassium: 4.9 mmol/L (ref 3.5–5.1)
Sodium: 143 mmol/L (ref 135–145)
TCO2: 42 mmol/L — ABNORMAL HIGH (ref 22–32)
pCO2 arterial: 75.5 mmHg (ref 32.0–48.0)
pH, Arterial: 7.328 — ABNORMAL LOW (ref 7.350–7.450)
pO2, Arterial: 77 mmHg — ABNORMAL LOW (ref 83.0–108.0)

## 2019-05-20 LAB — PHOSPHORUS: Phosphorus: 3.3 mg/dL (ref 2.5–4.6)

## 2019-05-20 LAB — GLUCOSE, CAPILLARY
Glucose-Capillary: 265 mg/dL — ABNORMAL HIGH (ref 70–99)
Glucose-Capillary: 251 mg/dL — ABNORMAL HIGH (ref 70–99)
Glucose-Capillary: 272 mg/dL — ABNORMAL HIGH (ref 70–99)
Glucose-Capillary: 186 mg/dL — ABNORMAL HIGH (ref 70–99)
Glucose-Capillary: 270 mg/dL — ABNORMAL HIGH (ref 70–99)

## 2019-05-20 LAB — HEPARIN LEVEL (UNFRACTIONATED): Heparin Unfractionated: 0.45 IU/mL (ref 0.30–0.70)

## 2019-05-20 LAB — C-REACTIVE PROTEIN: CRP: 22.9 mg/dL — ABNORMAL HIGH (ref <1.0)

## 2019-05-20 MED ORDER — FUROSEMIDE 10 MG/ML IJ SOLN
40.0000 mg | Freq: Four times a day (QID) | INTRAMUSCULAR | Status: AC
  Administered 2019-05-20 (×2): 40 mg via INTRAVENOUS
  Filled 2019-05-20 (×2): qty 4

## 2019-05-20 MED ORDER — INSULIN ASPART 100 UNIT/ML ~~LOC~~ SOLN
4.0000 [IU] | SUBCUTANEOUS | Status: DC
  Administered 2019-05-20 – 2019-05-22 (×12): 4 [IU] via SUBCUTANEOUS

## 2019-05-20 MED ORDER — PIPERACILLIN-TAZOBACTAM 3.375 G IVPB
3.3750 g | Freq: Three times a day (TID) | INTRAVENOUS | Status: DC
  Administered 2019-05-20 – 2019-05-25 (×15): 3.375 g via INTRAVENOUS
  Filled 2019-05-20 (×18): qty 50

## 2019-05-20 MED ORDER — PIPERACILLIN-TAZOBACTAM 3.375 G IVPB 30 MIN
3.3750 g | Freq: Once | INTRAVENOUS | Status: AC
  Administered 2019-05-20: 14:00:00 3.375 g via INTRAVENOUS
  Filled 2019-05-20: qty 50

## 2019-05-20 MED ORDER — INSULIN DETEMIR 100 UNIT/ML ~~LOC~~ SOLN
15.0000 [IU] | Freq: Two times a day (BID) | SUBCUTANEOUS | Status: DC
  Administered 2019-05-20 – 2019-05-22 (×5): 15 [IU] via SUBCUTANEOUS
  Filled 2019-05-20 (×6): qty 0.15

## 2019-05-20 MED FILL — Fentanyl Citrate Preservative Free (PF) Inj 2500 MCG/50ML: INTRAMUSCULAR | Qty: 50 | Status: AC

## 2019-05-20 MED FILL — Sodium Chloride IV Soln 0.9%: INTRAVENOUS | Qty: 250 | Status: AC

## 2019-05-20 NOTE — Progress Notes (Signed)
Contacted pt daughter, [name] and so in law via telephone, updated on pt status and POC. Family denied any questions at this time. LRS

## 2019-05-20 NOTE — Progress Notes (Signed)
ANTICOAGULATION CONSULT NOTE - Follow Up Consult  Pharmacy Consult for heparin; Zosyn  Indication: r/o PE ; HCAP  Allergies  Allergen Reactions  . Cefuroxime Rash  . Sulfamethoxazole-Trimethoprim Itching  . Aloe     itch  . Cephalosporins Rash    Patient Measurements: Height: 4\' 11"  (149.9 cm) Weight: 175 lb 14.8 oz (79.8 kg) IBW/kg (Calculated) : 43.2 Heparin Dosing Weight: 60 kg   Vital Signs: Temp: 98.8 F (37.1 C) (11/25 0400) Temp Source: Rectal (11/25 0400) BP: 142/84 (11/25 0813) Pulse Rate: 90 (11/25 0813)  Labs: Recent Labs    05/18/19 0434 05/18/19 1305  05/19/19 0612 05/19/19 1000 05/19/19 1035 05/19/19 1800 05/20/19 0602  HGB 9.8* 10.4*   < > 8.6*  --  9.5*  --  8.6*  HCT 34.8* 36.7   < > 30.2*  --  28.0*  --  30.2*  PLT 185 189  --  167  --   --   --  177  HEPARINUNFRC  --   --    < >  --  0.82*  --  0.59 0.45  CREATININE 0.46  --   --  0.52  --   --   --  0.50   < > = values in this interval not displayed.    Estimated Creatinine Clearance: 63.1 mL/min (by C-G formula based on SCr of 0.5 mg/dL).  Assessment: 84 YOF admitted with COVID-19 pneumonia. Currently with worsening hypoxemia and concern for PE. Pharmacy consulted to start IV heparin.   AC: HL remains therapeutic this AM on 700 units/hr of IV heparin. H/H low stable. Plt wnl   ID: Worsening leukocytosis with increasing ventilator support. Pharmacy consulted to start Zosyn for r/o HCAP. Cultures pending. SCr wnl. MRSA PCR neg    Goal of Therapy:  Heparin level 0.3-0.7 units/ml Monitor platelets by anticoagulation protocol: Yes     Plan:  Cont IV heparin to 700 units/hr  Monitor daily HL, CBC and s/s of bleeding   Zosyn 3.375 gm IV Q 8 hours (EI infusion) Monitor CBC, renal fx, cultures and clinical progress   Albertina Parr, PharmD., BCPS Clinical Pharmacist Clinical phone for 05/20/19 until 5pm: [phone]

## 2019-05-20 NOTE — Procedures (Signed)
Cortrak  Person Inserting Tube:  Maylon Peppers C, RD Tube Type:  Cortrak - 43 inches Tube Location:  Left nare Initial Placement:  Postpyloric Secured by: Bridle Technique Used to Measure Tube Placement:  Documented cm marking at nare/ corner of mouth Cortrak Secured At:  81 cm    Cortrak Tube Team Note:  Consult received to place a Cortrak feeding tube.   No x-ray is required. RN may begin using tube.   If the tube becomes dislodged please keep the tube and contact the Cortrak team at [email or web address] (password [email or web address]) for replacement.  If after hours and replacement cannot be delayed, place a NG tube and confirm placement with an abdominal x-ray.    [place], [place], [place] Pager [phone] After Hours Pager

## 2019-05-20 NOTE — Progress Notes (Signed)
Notifying physician

## 2019-05-20 NOTE — Progress Notes (Signed)
Patient placed in supine position.  ETT resecured with commercial tube holder.  Patient tolerated well.  Vent settings were changed to PC 30 due to increased peak pressures on the ventilator.

## 2019-05-20 NOTE — Progress Notes (Addendum)
NAME:  Teresa Barker, MRN:  [id number], DOB:  11-05-1950, LOS: 5 ADMISSION DATE:  05/08/2019, CONSULTATION DATE:  11/20 REFERRING MD:  Derrill Kay MD, CHIEF COMPLAINT:  COVID 19 pneumonia   Brief History   68 y/o female admitted 11/10 to [facility] for COVID pneumonia, intubated [facility] on 11/19, moved to [facility] ICU on 11/20.    Past Medical History  COPD DM2 Obesity Asthma GERD Hypertension Neuropathy  Significant Hospital Events   11/8- Admit to [facility] 11/19- Intubated, transfer to [facility] 11/21- New onset afib on admission > converted to NSR 11/22 pneumomediastinum and pneumopericardium seen on CT chest 11/23 Proning protocol initiated   Consults:   Procedures:  Femoral triple-lumen 11/19 outside hospital > 11/21 Rt PICC 11/21 >>  11/23>Bronch normal airways, no mucus plugging, no airway lesions.  Significant Diagnostic Tests:  11/22 CT chest images personally reviewed: pneumomediastinum and pneumopericardium noted, small bilateral pneumothoraces. Bilateral peripheral predominant ground glass opacification. 11/23 Echo EF 65 to 70%, LV hyperdynamic function.  Borderline LVH.  Indeterminate diastolic filling due to E-A fusion. Global RV systolic function normal.  RV systolic pressure 99991111 mmHg, moderately elevated. Trace MR. Mild TR. Mild calcification of AV. Moderately elevated pulmonary artery systolic pressure. Micro Data:  SARS COV2 11/7 at [place]- Positive Tracheal aspirate 11/24>> few GPC, few yeast MRSA surveillance negative  Antimicrobials/COVID19 Tx:  Completed remdesivir at [facility] Dexamethasone 11/20>>  Interim history/subjective:  Proned overnight. Now on FiO2 1.0.  Intermittent chemical paralysis.    Objective   Blood pressure (!) 155/93, pulse 89, temperature 98.8 F (37.1 C), temperature source Rectal, resp. rate 16, height 4\' 11"  (1.499 m), weight 79.8 kg, SpO2 (!) 83 %.    Vent Mode: PRVC FiO2 (%):  [90 %] 90 % Set Rate:  [35 bmp] 35  bmp Vt Set:  [300 mL] 300 mL PEEP:  [12 cmH20] 12 cmH20 Plateau Pressure:  [30 cmH20-37 cmH20] 34 cmH20   Intake/Output Summary (Last 24 hours) at 05/20/2019 0746 Last data filed at 05/20/2019 0700 Gross per 24 hour  Intake 1866.3 ml  Output 1160 ml  Net 706.3 ml   Filed Weights   05/16/19 0500 05/17/19 0400 05/18/19 0500  Weight: 74.6 kg 78.1 kg 79.8 kg    Examination:  General: Proned, obese, elderly, critically ill-appearing female.  Sedated and chemically paralyzed HENT: Normocephalic, PERRL.  Neck: Unable to assess CV: +2 distal pulses Lungs: Posterior BBS significantly diminished at bases, shallow, mild rhonchi, vent supported.  Low VT ABD: unable to assess GU: Foley EXT: Trace edema Skin: Small ulceration noted to inside of upper lip.  Pressure dressings at points of contact for proning position Neuro: Chemically sedated and paralyzed, occasionally moves head   11/25 CXR pending  Resolved Hospital Problem list     Assessment & Plan:  ARDS: worsening hypoxemia, severe ventilator dyssynchrony Started on continuous paralytics 11/23, now on intermittent paralysis with improved synchrony Continue mechanical ventilation per ARDS protocol Target TVol 6-8cc/kgIBW Target Plateau Pressure < 30cm H20 Target driving pressure less than 15 cm of water Target PaO2 55-65: titrate PEEP/FiO2 per protocol As long as PaO2 to FiO2 ratio is less than 1:150 position in prone position for 16 hours a day Check CVP daily if CVL in place Target CVP less than 4, diurese as necessary Ventilator associated pneumonia prevention protocol Continue proning protocol Intermittent paralytic Continue steroids Continue heparin infusion initiated 11/23 d/t worsening hypoxemia in the setting of Covid, high risk for PE-echo reassuring for no RV strain.  D/t worsening hypoxia and failure to improve will initiate monotherapy abx with Zosyn (allergy to cephalopsorins). MRSA swab negative. Few GPC/yeast  in tracheal aspirate   Sedation needs for ventilator synchrony Continue RASS target -5 while paralyzed Versed infusion Fentanyl infusion Intermittent paralysis as noted above Continue oxycodone and clonazepam  Pneumomediastinum with pneumopericardium and small bilateral pneumothoraces CXR daily-pending  Monitor closely  Diabetes mellitus with hyperglycemia placated by steroids Continue SSI Continue detemir insulin-increase to 15U BID  Continue linagliptin  Atrial Fib-currently remaining in sinus rhythm with PACs and PVCs Tele  Anemia-likely of critical illness and nutritional. No SnSx bleeding. On heparin Monitor   Best practice:  Diet: tube feeding Pain/Anxiety/Delirium protocol (if indicated): as above VAP protocol (if indicated): yes DVT prophylaxis: Heparin infusion GI prophylaxis: famotidine Glucose control: SSI Mobility: bed rest Code Status: DNR if cardiac arrest Family Communication: will update  Disposition: remain in ICU  Labs reviewed in EMR    The patient is critically ill with ARDS/respiratory failure. She requires ongoing ICU for high complexity decision making, titration of high alert medications, ventilator management, titration of oxygen and interpretation of advanced monitoring.    I personally spent 45 minutes providing critical care services including personally reviewing test results, discussing care with nursing staff/other physicians and completing orders pertaining to this patient.  Time was exclusive to the patient and does not include time spent teaching or in procedures.  Voice recognition software was used in the production of this record.  Errors in interpretation may have been inadvertently missed during review.  Francine Graven, MSN, AGACNP  [facility]

## 2019-05-20 NOTE — Progress Notes (Signed)
Turned patient from proned position;  Vent settings 100% FiO2 with 35RR and PEEP 14.  Patient B/P increased from 150's SBP to 210's/ 100's.  Notified hospitalist via page and awaiting response.  Notified RT of patient vent signaling "Regulated Pressure Limited" warning.  Suctioned patient ETT x4.  RT at bedside.

## 2019-05-20 NOTE — Progress Notes (Signed)
repositionig cuff and reassessing B/P

## 2019-05-21 ENCOUNTER — Inpatient Hospital Stay (HOSPITAL_COMMUNITY): Payer: Medicare HMO

## 2019-05-21 DIAGNOSIS — E099 Drug or chemical induced diabetes mellitus without complications: Secondary | ICD-10-CM | POA: Diagnosis not present

## 2019-05-21 DIAGNOSIS — Z9911 Dependence on respirator [ventilator] status: Secondary | ICD-10-CM | POA: Diagnosis not present

## 2019-05-21 DIAGNOSIS — U071 COVID-19: Secondary | ICD-10-CM | POA: Diagnosis not present

## 2019-05-21 LAB — GLUCOSE, CAPILLARY
Glucose-Capillary: 216 mg/dL — ABNORMAL HIGH (ref 70–99)
Glucose-Capillary: 138 mg/dL — ABNORMAL HIGH (ref 70–99)
Glucose-Capillary: 269 mg/dL — ABNORMAL HIGH (ref 70–99)
Glucose-Capillary: 103 mg/dL — ABNORMAL HIGH (ref 70–99)
Glucose-Capillary: 191 mg/dL — ABNORMAL HIGH (ref 70–99)
Glucose-Capillary: 263 mg/dL — ABNORMAL HIGH (ref 70–99)

## 2019-05-21 LAB — BASIC METABOLIC PANEL
Anion gap: 11 (ref 5–15)
BUN: 35 mg/dL — ABNORMAL HIGH (ref 8–23)
CO2: 40 mmol/L — ABNORMAL HIGH (ref 22–32)
Calcium: 8.5 mg/dL — ABNORMAL LOW (ref 8.9–10.3)
Chloride: 91 mmol/L — ABNORMAL LOW (ref 98–111)
Creatinine, Ser: 0.5 mg/dL (ref 0.44–1.00)
GFR calc Af Amer: >60 mL/min (ref >60)
GFR calc non Af Amer: >60 mL/min (ref >60)
Glucose, Bld: 204 mg/dL — ABNORMAL HIGH (ref 70–99)
Potassium: 3.7 mmol/L (ref 3.5–5.1)
Sodium: 142 mmol/L (ref 135–145)

## 2019-05-21 LAB — POCT I-STAT 7, (LYTES, BLD GAS, ICA,H+H)
Acid-Base Excess: 19 mmol/L — ABNORMAL HIGH (ref 0.0–2.0)
Bicarbonate: 46 mmol/L — ABNORMAL HIGH (ref 20.0–28.0)
Calcium, Ion: 1.18 mmol/L (ref 1.15–1.40)
HCT: 27 % — ABNORMAL LOW (ref 36.0–46.0)
Hemoglobin: 9.2 g/dL — ABNORMAL LOW (ref 12.0–15.0)
O2 Saturation: 87 %
Patient temperature: 98.5
Potassium: 4.1 mmol/L (ref 3.5–5.1)
Sodium: 140 mmol/L (ref 135–145)
TCO2: 48 mmol/L — ABNORMAL HIGH (ref 22–32)
pCO2 arterial: 65.8 mmHg (ref 32.0–48.0)
pH, Arterial: 7.452 — ABNORMAL HIGH (ref 7.350–7.450)
pO2, Arterial: 54 mmHg — ABNORMAL LOW (ref 83.0–108.0)

## 2019-05-21 LAB — CBC
HCT: 29.6 % — ABNORMAL LOW (ref 36.0–46.0)
Hemoglobin: 8.6 g/dL — ABNORMAL LOW (ref 12.0–15.0)
MCH: 26.4 pg (ref 26.0–34.0)
MCHC: 29.1 g/dL — ABNORMAL LOW (ref 30.0–36.0)
MCV: 90.8 fL (ref 80.0–100.0)
Platelets: 187 10*3/uL (ref 150–400)
RBC: 3.26 MIL/uL — ABNORMAL LOW (ref 3.87–5.11)
RDW: 17.3 % — ABNORMAL HIGH (ref 11.5–15.5)
WBC: 21.3 10*3/uL — ABNORMAL HIGH (ref 4.0–10.5)
nRBC: 0.6 % — ABNORMAL HIGH (ref 0.0–0.2)

## 2019-05-21 LAB — HEPARIN LEVEL (UNFRACTIONATED)
Heparin Unfractionated: <0.1 IU/mL — ABNORMAL LOW (ref 0.30–0.70)
Heparin Unfractionated: <0.1 IU/mL — ABNORMAL LOW (ref 0.30–0.70)
Heparin Unfractionated: 0.1 IU/mL — ABNORMAL LOW (ref 0.30–0.70)

## 2019-05-21 LAB — PROCALCITONIN: Procalcitonin: 1.75 ng/mL

## 2019-05-21 MED ORDER — HEPARIN BOLUS VIA INFUSION
2000.0000 [IU] | Freq: Once | INTRAVENOUS | Status: AC
  Administered 2019-05-22: 2000 [IU] via INTRAVENOUS
  Filled 2019-05-21: qty 2000

## 2019-05-21 NOTE — Progress Notes (Signed)
ETT secured with cloth tape. Foam adhesive placed on cheeks/upper lip. Patient proned. Arms in rotation position. Tolerated well.

## 2019-05-21 NOTE — Progress Notes (Signed)
Assisted tele visit to patient with family member. Correct room number, name, and phone number verified with bedside RN.   Teresa Barker, Janace Hoard, RN

## 2019-05-21 NOTE — Progress Notes (Addendum)
NAME:  Teresa Barker, MRN:  [place]:[id number], DOB:  Jul 05, 1950, LOS: 6 ADMISSION DATE:  04/30/2019, CONSULTATION DATE:  11/20 REFERRING MD:  Derrill Kay MD, CHIEF COMPLAINT:  COVID 19 pneumonia   Brief History   68 y/o female admitted 11/10 to [facility] for COVID pneumonia, intubated [facility] on 11/19, moved to [facility] ICU on 11/20.    Past Medical History  COPD DM2 Obesity Asthma GERD Hypertension Neuropathy  Significant Hospital Events   11/8- Admit to [facility] 11/19- Intubated, transfer to [facility] 11/21- New onset afib on admission > converted to NSR 11/22 pneumomediastinum and pneumopericardium seen on CT chest 11/23 Proning protocol initiated   Consults:   Procedures:  Femoral triple-lumen 11/19 outside hospital > 11/21 Rt PICC 11/21 >>  11/23>Bronch normal airways, no mucus plugging, no airway lesions.  Significant Diagnostic Tests:  11/22 CT chest images personally reviewed: pneumomediastinum and pneumopericardium noted, small bilateral pneumothoraces. Bilateral peripheral predominant ground glass opacification. 11/23 Echo EF 65 to 70%, LV hyperdynamic function.  Borderline LVH.  Indeterminate diastolic filling due to E-A fusion. Global RV systolic function normal.  RV systolic pressure 99991111 mmHg, moderately elevated. Trace MR. Mild TR. Mild calcification of AV. Moderately elevated pulmonary artery systolic pressure. Micro Data:  SARS COV2 11/7 at [place]- Positive Tracheal aspirate 11/24>> few GPC, few yeast MRSA surveillance negative  Antimicrobials/COVID19 Tx:  Completed remdesivir at [facility] Dexamethasone 11/20>>  Interim history/subjective:  Remained prone overnight, but increased plateau and driving pressures since evening proning.  Plateau greater than 30 since last evening.   Objective   Blood pressure 128/68, pulse 81, temperature 98.2 F (36.8 C), temperature source Oral, resp. rate (!) 28, height 4\' 11"  (1.499 m), weight 80 kg, SpO2 94 %.    Vent  Mode: PRVC FiO2 (%):  [100 %] 100 % Set Rate:  [35 bmp] 35 bmp Vt Set:  [300 mL] 300 mL PEEP:  [14 cmH20] 14 cmH20 Plateau Pressure:  [29 cmH20-42 cmH20] 35 cmH20   Intake/Output Summary (Last 24 hours) at 05/21/2019 1335 Last data filed at 05/21/2019 1305 Gross per 24 hour  Intake 2000.14 ml  Output 4900 ml  Net -2899.86 ml   Filed Weights   05/17/19 0400 05/18/19 0500 05/21/19 0500  Weight: 78.1 kg 79.8 kg 80 kg    Examination:  General: Obese elderly woman lying in bed comfortably, prone to HENT: [place]/AT, eyes anicteric CV: Regular rate and rhythm Lungs: CTA B, low tidal volumes, symmetric breath sounds ABD: Obese, soft GU: Foley EXT: No significant pitting edema, no clubbing or cyanosis Skin: No wounds seen Neuro: RASS -5  CXR 11/25-diffuse reticular changes bilaterally, ET tube remains in place, RUE PICC, OG tube.  Bedside ultrasound performed in supine position-sliding lung and all posterior rib spaces bilaterally.  Resolved Hospital Problem list     Assessment & Plan:  ARDS: worsening hypoxemia, severe ventilator dyssynchrony Started on continuous paralytics 11/23, now on intermittent paralysis with improved synchrony.  Worsening lung mechanics. -Supinate early today to better assess because of worsening lung mechanics since proning.  Will likely prone again this afternoon.  Repeat ABG this afternoon -Continue LTV V, 4 to 6 cc/kg ideal body weight with goal plateau less than 30 and driving pressure less than 15.  Please follow the ARDS PEEP-FiO2 ladder. -Daily SAT and SBT once appropriate -Remained heavily sedated to tolerate vent synchrony, prone positioning, and need for as needed paralytics -As needed paralytics for significant vent dyssynchrony -VAP prevention protocol -Continue steroids; previously completed a  course of remdesivir -Okay to continue empiric Zosyn; would like to obtain a respiratory culture if expectorating sputum.  Sedation needs for  ventilator synchrony -Continue fentanyl and Versed for goal RASS -5  Pneumomediastinum with pneumopericardium and small bilateral pneumothoraces.  No pneumothorax was able to be identified on bedside ultrasound exam today -CXR post supination  Diabetes mellitus with hyperglycemia placated by steroids -Continue Accu-Cheks every 4 hours and sliding scale insulin as needed -Continue detemir and linagliptin -Goal BG 140-180 while admitted to the ICU  Atrial Fib-currently remaining in sinus rhythm with PACs and PVCs -Continue to monitor on telemetry  Anemia-likely of critical illness and nutritional. No SnSx bleeding. On heparin -Continue to monitor -No plans to transfuse unless hemoglobin less than 7  Best practice:  Diet: tube feeding Pain/Anxiety/Delirium protocol (if indicated): as above VAP protocol (if indicated): yes DVT prophylaxis: Heparin infusion GI prophylaxis: famotidine Glucose control: SSI Mobility: bed rest Code Status: DNR if cardiac arrest Family Communication: will update  Disposition:  ICU  Labs reviewed in EMR   This patient is critically ill with multiple organ system failure which requires frequent high complexity decision making, assessment, support, evaluation, and titration of therapies. This was completed through the application of advanced monitoring technologies and extensive interpretation of multiple databases. During this encounter critical care time was devoted to patient care services described in this note for 50 minutes.  Julian Hy, DO 05/21/19 1:43 PM [facility]    Daughter [name] updated by phone & all questions answered.  Plateau pressures remained elevated post-supination. Vt reduced to 260cc to get Pplat <30 based on ABG results. She will be proned again overnight tonight. Repeat ABG in AM.  Critical care time: 20 minutes  Julian Hy, DO 05/21/19 5:06 PM [facility]

## 2019-05-21 NOTE — Progress Notes (Addendum)
ANTICOAGULATION CONSULT NOTE - Follow Up Consult  Pharmacy Consult for heparin Indication: r/o PE   Allergies  Allergen Reactions  . Cefuroxime Rash  . Sulfamethoxazole-Trimethoprim Itching  . Aloe     itch  . Cephalosporins Rash    Patient Measurements: Height: 4\' 11"  (149.9 cm) Weight: 176 lb 5.9 oz (80 kg) IBW/kg (Calculated) : 43.2 Heparin Dosing Weight: 60 kg   Vital Signs: Temp: 98.8 F (37.1 C) (11/26 2000) Temp Source: Oral (11/26 2000) BP: 144/72 (11/26 2200) Pulse Rate: 91 (11/26 2200)  Labs: Recent Labs    05/19/19 0612  05/20/19 0602 05/20/19 1602 05/21/19 0350 05/21/19 0652 05/21/19 1205 05/21/19 1611 05/21/19 2200  HGB 8.6*   < > 8.6* 10.2* 8.6*  --   --  9.2*  --   HCT 30.2*   < > 30.2* 30.0* 29.6*  --   --  27.0*  --   PLT 167  --  177  --  187  --   --   --   --   HEPARINUNFRC  --    < > 0.45  --   --  <0.10* <0.10*  --  0.10*  CREATININE 0.52  --  0.50  --  0.50  --   --   --   --    < > = values in this interval not displayed.    Estimated Creatinine Clearance: 63.2 mL/min (by C-G formula based on SCr of 0.5 mg/dL).  Assessment: 60 YOF admitted with COVID-19 pneumonia. Currently with worsening hypoxemia and concern for PE. Pharmacy consulted to start IV heparin.   HL was normal to high until yesterday (possibly lingering effects of Lovenox doses) but has been undetectable today x 2 on same rate of 700 units/hr. Heparin level 0.1 (subtherapeutic) on increased rate of 1000 units/hr. No issues with line reported per RN. Small amount of mouth bleeding as pt bit tongue.  Goal of Therapy:  Heparin level 0.3-0.7 units/ml Monitor platelets by anticoagulation protocol: Yes    Plan:  Bolus IV heparin 2000 units/hr Increase IV heparin to 1250 units/hr F/u 6 hr HL  Monitor daily HL, CBC and s/s of bleeding   Sherlon Handing, PharmD, BCPS Please see amion for complete clinical pharmacist phone list 05/21/2019 11:30 PM

## 2019-05-21 NOTE — Progress Notes (Signed)
ANTICOAGULATION CONSULT NOTE - Follow Up Consult  Pharmacy Consult for heparin Indication: r/o PE   Allergies  Allergen Reactions  . Cefuroxime Rash  . Sulfamethoxazole-Trimethoprim Itching  . Aloe     itch  . Cephalosporins Rash    Patient Measurements: Height: 4\' 11"  (149.9 cm) Weight: 176 lb 5.9 oz (80 kg) IBW/kg (Calculated) : 43.2 Heparin Dosing Weight: 60 kg   Vital Signs: Temp: 98.8 F (37.1 C) (11/26 0800) Temp Source: Oral (11/26 0800) BP: 124/67 (11/26 0900) Pulse Rate: 80 (11/26 0900)  Labs: Recent Labs    05/19/19 0612  05/19/19 1800 05/20/19 0602 05/20/19 1602 05/21/19 0350 05/21/19 0652  HGB 8.6*   < >  --  8.6* 10.2* 8.6*  --   HCT 30.2*   < >  --  30.2* 30.0* 29.6*  --   PLT 167  --   --  177  --  187  --   HEPARINUNFRC  --    < > 0.59 0.45  --   --  <0.10*  CREATININE 0.52  --   --  0.50  --  0.50  --    < > = values in this interval not displayed.    Estimated Creatinine Clearance: 63.2 mL/min (by C-G formula based on SCr of 0.5 mg/dL).  Assessment: 84 YOF admitted with COVID-19 pneumonia. Currently with worsening hypoxemia and concern for PE. Pharmacy consulted to start IV heparin.   HL was normal to high until yesterday but was undetectable this AM although heparin infusion rate was stable at 700 units/hr. A stat repeat HL today remained undetectable. H/H remains low stable. Plt wnl.   Goal of Therapy:  Heparin level 0.3-0.7 units/ml Monitor platelets by anticoagulation protocol: Yes     Plan:  Increase IV heparin to 1000 units/hr F/u 6 hr HL  Monitor daily HL, CBC and s/s of bleeding   Albertina Parr, PharmD., BCPS Clinical Pharmacist Clinical phone for 05/20/19 until 5pm: [phone]

## 2019-05-21 NOTE — Progress Notes (Signed)
Updated daughter Joyice Faster.

## 2019-05-21 NOTE — Plan of Care (Signed)
Note: Pt proned at 2330 on 05/20/19 with RT present. Protective mepilex placed on bony prominences and bilat breats. Plt continues Versed, fentanyl, and heparin gtt. Pt vent settings PRVC 100% FiO2, PEEP 14, Rate 35. VSS overnight.   Problem: Clinical Measurements: Goal: Will remain free from infection Outcome: Progressing Goal: Diagnostic test results will improve Outcome: Progressing Goal: Cardiovascular complication will be avoided Outcome: Progressing   Problem: Nutrition: Goal: Adequate nutrition will be maintained Outcome: Progressing   Problem: Elimination: Goal: Will not experience complications related to bowel motility Outcome: Progressing Goal: Will not experience complications related to urinary retention Outcome: Progressing   Problem: Pain Managment: Goal: General experience of comfort will improve Outcome: Progressing   Problem: Safety: Goal: Ability to remain free from injury will improve Outcome: Progressing   Problem: Skin Integrity: Goal: Risk for impaired skin integrity will decrease Outcome: Progressing

## 2019-05-22 DIAGNOSIS — E099 Drug or chemical induced diabetes mellitus without complications: Secondary | ICD-10-CM | POA: Diagnosis not present

## 2019-05-22 DIAGNOSIS — Z9911 Dependence on respirator [ventilator] status: Secondary | ICD-10-CM | POA: Diagnosis not present

## 2019-05-22 DIAGNOSIS — U071 COVID-19: Secondary | ICD-10-CM | POA: Diagnosis not present

## 2019-05-22 DIAGNOSIS — J9600 Acute respiratory failure, unspecified whether with hypoxia or hypercapnia: Secondary | ICD-10-CM | POA: Diagnosis not present

## 2019-05-22 LAB — POCT I-STAT 7, (LYTES, BLD GAS, ICA,H+H)
Acid-Base Excess: 19 mmol/L — ABNORMAL HIGH (ref 0.0–2.0)
Acid-Base Excess: 19 mmol/L — ABNORMAL HIGH (ref 0.0–2.0)
Bicarbonate: 45.7 mmol/L — ABNORMAL HIGH (ref 20.0–28.0)
Bicarbonate: 46.3 mmol/L — ABNORMAL HIGH (ref 20.0–28.0)
Calcium, Ion: 1.22 mmol/L (ref 1.15–1.40)
Calcium, Ion: 1.2 mmol/L (ref 1.15–1.40)
HCT: 27 % — ABNORMAL LOW (ref 36.0–46.0)
HCT: 27 % — ABNORMAL LOW (ref 36.0–46.0)
Hemoglobin: 9.2 g/dL — ABNORMAL LOW (ref 12.0–15.0)
Hemoglobin: 9.2 g/dL — ABNORMAL LOW (ref 12.0–15.0)
O2 Saturation: 84 %
O2 Saturation: 87 %
Patient temperature: 98.8
Patient temperature: 98.8
Potassium: 3.6 mmol/L (ref 3.5–5.1)
Potassium: 3.5 mmol/L (ref 3.5–5.1)
Sodium: 143 mmol/L (ref 135–145)
Sodium: 142 mmol/L (ref 135–145)
TCO2: 48 mmol/L — ABNORMAL HIGH (ref 22–32)
TCO2: 48 mmol/L — ABNORMAL HIGH (ref 22–32)
pCO2 arterial: 69.3 mmHg (ref 32.0–48.0)
pCO2 arterial: 70.4 mmHg (ref 32.0–48.0)
pH, Arterial: 7.428 (ref 7.350–7.450)
pH, Arterial: 7.427 (ref 7.350–7.450)
pO2, Arterial: 50 mmHg — ABNORMAL LOW (ref 83.0–108.0)
pO2, Arterial: 55 mmHg — ABNORMAL LOW (ref 83.0–108.0)

## 2019-05-22 LAB — BASIC METABOLIC PANEL
Anion gap: 10 (ref 5–15)
Anion gap: 8 (ref 5–15)
BUN: 33 mg/dL — ABNORMAL HIGH (ref 8–23)
BUN: 29 mg/dL — ABNORMAL HIGH (ref 8–23)
CO2: 40 mmol/L — ABNORMAL HIGH (ref 22–32)
CO2: 37 mmol/L — ABNORMAL HIGH (ref 22–32)
Calcium: 8.5 mg/dL — ABNORMAL LOW (ref 8.9–10.3)
Calcium: 8.1 mg/dL — ABNORMAL LOW (ref 8.9–10.3)
Chloride: 95 mmol/L — ABNORMAL LOW (ref 98–111)
Chloride: 98 mmol/L (ref 98–111)
Creatinine, Ser: 0.52 mg/dL (ref 0.44–1.00)
Creatinine, Ser: 0.49 mg/dL (ref 0.44–1.00)
GFR calc Af Amer: >60 mL/min (ref >60)
GFR calc Af Amer: >60 mL/min (ref >60)
GFR calc non Af Amer: >60 mL/min (ref >60)
GFR calc non Af Amer: >60 mL/min (ref >60)
Glucose, Bld: 92 mg/dL (ref 70–99)
Glucose, Bld: 191 mg/dL — ABNORMAL HIGH (ref 70–99)
Potassium: 3.8 mmol/L (ref 3.5–5.1)
Potassium: 5 mmol/L (ref 3.5–5.1)
Sodium: 145 mmol/L (ref 135–145)
Sodium: 143 mmol/L (ref 135–145)

## 2019-05-22 LAB — CULTURE, RESPIRATORY W GRAM STAIN
Culture: NORMAL
Special Requests: NORMAL

## 2019-05-22 LAB — GLUCOSE, CAPILLARY
Glucose-Capillary: 103 mg/dL — ABNORMAL HIGH (ref 70–99)
Glucose-Capillary: 149 mg/dL — ABNORMAL HIGH (ref 70–99)
Glucose-Capillary: 190 mg/dL — ABNORMAL HIGH (ref 70–99)
Glucose-Capillary: 292 mg/dL — ABNORMAL HIGH (ref 70–99)
Glucose-Capillary: 292 mg/dL — ABNORMAL HIGH (ref 70–99)
Glucose-Capillary: 332 mg/dL — ABNORMAL HIGH (ref 70–99)
Glucose-Capillary: 333 mg/dL — ABNORMAL HIGH (ref 70–99)
Glucose-Capillary: 87 mg/dL (ref 70–99)

## 2019-05-22 LAB — CBC
HCT: 29.3 % — ABNORMAL LOW (ref 36.0–46.0)
HCT: 32.2 % — ABNORMAL LOW (ref 36.0–46.0)
Hemoglobin: 8.4 g/dL — ABNORMAL LOW (ref 12.0–15.0)
Hemoglobin: 9 g/dL — ABNORMAL LOW (ref 12.0–15.0)
MCH: 26.3 pg (ref 26.0–34.0)
MCH: 26.5 pg (ref 26.0–34.0)
MCHC: 28.7 g/dL — ABNORMAL LOW (ref 30.0–36.0)
MCHC: 28 g/dL — ABNORMAL LOW (ref 30.0–36.0)
MCV: 91.8 fL (ref 80.0–100.0)
MCV: 94.7 fL (ref 80.0–100.0)
Platelets: 169 10*3/uL (ref 150–400)
Platelets: 187 10*3/uL (ref 150–400)
RBC: 3.19 MIL/uL — ABNORMAL LOW (ref 3.87–5.11)
RBC: 3.4 MIL/uL — ABNORMAL LOW (ref 3.87–5.11)
RDW: 17.5 % — ABNORMAL HIGH (ref 11.5–15.5)
RDW: 18.2 % — ABNORMAL HIGH (ref 11.5–15.5)
WBC: 22.7 10*3/uL — ABNORMAL HIGH (ref 4.0–10.5)
WBC: 25.4 10*3/uL — ABNORMAL HIGH (ref 4.0–10.5)
nRBC: 1 % — ABNORMAL HIGH (ref 0.0–0.2)
nRBC: 1.4 % — ABNORMAL HIGH (ref 0.0–0.2)

## 2019-05-22 LAB — PROCALCITONIN: Procalcitonin: 0.98 ng/mL

## 2019-05-22 LAB — HEPARIN LEVEL (UNFRACTIONATED)
Heparin Unfractionated: 0.32 IU/mL (ref 0.30–0.70)
Heparin Unfractionated: 0.45 IU/mL (ref 0.30–0.70)

## 2019-05-22 LAB — MAGNESIUM: Magnesium: 2 mg/dL (ref 1.7–2.4)

## 2019-05-22 MED ORDER — FUROSEMIDE 10 MG/ML IJ SOLN
40.0000 mg | Freq: Once | INTRAMUSCULAR | Status: AC
  Administered 2019-05-22: 40 mg via INTRAVENOUS
  Filled 2019-05-22: qty 4

## 2019-05-22 MED ORDER — HYDROMORPHONE HCL 1 MG/ML IJ SOLN
0.5000 mg | Freq: Once | INTRAMUSCULAR | Status: AC
  Administered 2019-05-22: 0.5 mg via INTRAVENOUS

## 2019-05-22 MED ORDER — POTASSIUM CHLORIDE 20 MEQ/15ML (10%) PO SOLN
40.0000 meq | Freq: Once | ORAL | Status: AC
  Administered 2019-05-22: 40 meq
  Filled 2019-05-22: qty 30

## 2019-05-22 MED ORDER — HYDROMORPHONE BOLUS VIA INFUSION
0.2000 mg | INTRAVENOUS | Status: DC | PRN
  Filled 2019-05-22: qty 1

## 2019-05-22 MED ORDER — PHENOBARBITAL SODIUM 130 MG/ML IJ SOLN
130.0000 mg | Freq: Once | INTRAMUSCULAR | Status: AC
  Administered 2019-05-22: 130 mg via INTRAVENOUS
  Filled 2019-05-22: qty 1

## 2019-05-22 MED ORDER — INSULIN ASPART 100 UNIT/ML ~~LOC~~ SOLN
6.0000 [IU] | SUBCUTANEOUS | Status: DC
  Administered 2019-05-22 – 2019-05-28 (×28): 6 [IU] via SUBCUTANEOUS

## 2019-05-22 MED ORDER — FAMOTIDINE 40 MG/5ML PO SUSR
20.0000 mg | Freq: Two times a day (BID) | ORAL | Status: DC
  Administered 2019-05-22 – 2019-05-30 (×17): 20 mg
  Filled 2019-05-22 (×17): qty 2.5

## 2019-05-22 MED ORDER — SODIUM CHLORIDE 0.9 % IV SOLN
1.0000 mg/h | INTRAVENOUS | Status: DC
  Administered 2019-05-22: 18:00:00 1 mg/h via INTRAVENOUS
  Administered 2019-05-24 – 2019-05-25 (×2): 2 mg/h via INTRAVENOUS
  Administered 2019-05-25: 4 mg/h via INTRAVENOUS
  Filled 2019-05-22 (×6): qty 5

## 2019-05-22 MED ORDER — SODIUM CHLORIDE 0.9 % IV SOLN
0.0000 ug/kg/min | INTRAVENOUS | Status: DC
  Administered 2019-05-22 – 2019-05-23 (×2): 3 ug/kg/min via INTRAVENOUS
  Filled 2019-05-22 (×3): qty 20

## 2019-05-22 MED ORDER — STERILE WATER FOR INJECTION IJ SOLN
INTRAMUSCULAR | Status: AC
  Administered 2019-05-22: 10 mL
  Filled 2019-05-22: qty 10

## 2019-05-22 MED FILL — Fentanyl Citrate Preservative Free (PF) Inj 2500 MCG/50ML: INTRAMUSCULAR | Qty: 50 | Status: AC

## 2019-05-22 MED FILL — Sodium Chloride IV Soln 0.9%: INTRAVENOUS | Qty: 250 | Status: AC

## 2019-05-22 NOTE — Progress Notes (Signed)
Pt unproned at this time with no complications. Pt ett secured by commercial ett tube hlder upon placing supine. It remains secure and in proper position. VS within normal limits. No breakdown noted, Small abrasions on tip of tongue from pt tongue resting on teeth during prone.

## 2019-05-22 NOTE — Progress Notes (Signed)
NAME:  Teresa Barker, MRN:  [id number], DOB:  02/28/1951, LOS: 7 ADMISSION DATE:  05/25/2019, CONSULTATION DATE:  11/20 REFERRING MD:  Derrill Kay MD, CHIEF COMPLAINT:  COVID 19 pneumonia   Brief History   68 y/o female admitted 11/10 to [facility] for COVID pneumonia, intubated [facility] on 11/19, moved to [facility] ICU on 11/20.    Past Medical History  COPD DM2 Obesity Asthma GERD Hypertension Neuropathy  Significant Hospital Events   11/8- Admit to [facility] 11/19- Intubated, transfer to [facility] 11/21- New onset afib on admission > converted to NSR 11/22 pneumomediastinum and pneumopericardium seen on CT chest 11/23 Proning protocol initiated   Consults:   Procedures:  Femoral triple-lumen 11/19 outside hospital > 11/21 Rt PICC 11/21 >>  11/23>Bronch normal airways, no mucus plugging, no airway lesions.  Significant Diagnostic Tests:  11/22 CT chest images personally reviewed: pneumomediastinum and pneumopericardium noted, small bilateral pneumothoraces. Bilateral peripheral predominant ground glass opacification. 11/23 Echo EF 65 to 70%, LV hyperdynamic function.  Borderline LVH.  Indeterminate diastolic filling due to E-A fusion. Global RV systolic function normal.  RV systolic pressure 99991111 mmHg, moderately elevated. Trace MR. Mild TR. Mild calcification of AV. Moderately elevated pulmonary artery systolic pressure. Micro Data:  S[facility] COV2 11/7 at [facility]- Positive Tracheal aspirate 11/24>> few GPC, few yeast, c/w normal respiratory flora MRSA surveillance negative  Antimicrobials/COVID19 Tx:  Completed remdesivir at [facility] Dexamethasone 11/20>> Pip/Tazo 11/25>>  Interim history/subjective:  Remained prone overnight, continues to have increased plateau and driving pressures.  100% FiO2 SPO2 70s to low 80s.    Objective   Blood pressure 136/70, pulse 89, temperature 98.8 F (37.1 C), resp. rate 16, height 4\' 11"  (1.499 m), weight 80 kg, SpO2 93 %.    Vent  Mode: PCV FiO2 (%):  [100 %] 100 % Set Rate:  [35 bmp] 35 bmp Vt Set:  [260 mL-300 mL] 260 mL PEEP:  [14 cmH20] 14 cmH20 Plateau Pressure:  [28 cmH20-35 cmH20] 28 cmH20   Intake/Output Summary (Last 24 hours) at 05/22/2019 0827 Last data filed at 05/22/2019 0600 Gross per 24 hour  Intake 1427.78 ml  Output 1390 ml  Net 37.78 ml   Filed Weights   05/17/19 0400 05/18/19 0500 05/21/19 0500  Weight: 78.1 kg 79.8 kg 80 kg    Examination:  General: Well-developed, obese, critically ill appearing lady, prone position. HENT: Normocephalic, PERRL. Neck: Posterior neck unremarkable. CV: Posterior assessment,  +2 distal pulses Lungs: Posterior BBS significantly diminished at bases, slightly improved aeration upper lobes.  Poor excursion. No rhonchi or wheezing.  Vent supported. ABD: Unable to assess GU: Foley EXT: No edema Skin: PWD.  Mepilex dressings to pressure points Neuro: Chemically paralyzed, sedated    Resolved Hospital Problem list     Assessment & Plan:  ARDS: Refractory hypoxemia, severe ventilator dyssynchrony Continues to require intermittent paralytic for vent synchrony and increasing sedation  -P:F remains less than 150, continue proning  -Continue LTV V, 4 to 6 cc/kg ideal body weight with goal plateau less than 30 and driving pressure less than 15.  Please follow the ARDS PEEP-FiO2 ladder. -Not appropriate for SAT and SBT -Remained heavily sedated to tolerate vent synchrony, prone positioning.  Goal RASS -5 -Change to continuous paralytics, TO4 monitoring due to refractory hypoxemia and dyssynchrony -VAP prevention protocol -Continue steroids; previously completed a course of remdesivir -continue empiric Zosyn for now  -Lasix 40 mg IV x1 dose for goal negative balance. +3.8L.    -weight 11/20  78.8Kg, 11/26 80Kg   Sedation needs for ventilator synchrony -Continue fentanyl and Versed for goal RASS -5  Pneumomediastinum with pneumopericardium and small  bilateral pneumothoraces. -CXR post supination  Diabetes mellitus with hyperglycemia complicated by steroids  -Continue Accu-Cheks every 4 hours and sliding scale insulin as needed in addition to 6U Q4H coverage  -Continue detemir and linagliptin -Goal BG 140-180 while admitted to the ICU  Atrial Fib-currently remaining in sinus rhythm with PACs and PVCs -Continue to monitor on telemetry  Anemia-likely of critical illness and nutritional. No SnSx bleeding. On heparin -Continue to monitor -No plans to transfuse unless hemoglobin less than 7  Best practice:  Diet: tube feeding Pain/Anxiety/Delirium protocol (if indicated): as above VAP protocol (if indicated): yes DVT prophylaxis: Heparin infusion GI prophylaxis: famotidine Glucose control: SSI, detemir and linagliptin Mobility: bed rest Code Status: DNR if cardiac arrest Family Communication: updated daughter [name] and son in Pharmacologist by phone.  I expressed concern for refractory hypoxemia which may soon lead to endorgan failure and that the patient may not survive despite maximum efforts.  They are going to discuss this with other family members. Disposition: Continue ICU  Labs reviewed in EMR       The patient is critically ill with ARDS/respiratory failure/refractory hypoxemia. She requires ICU for high complexity decision making, titration of high alert medications, ventilator management, titration of oxygen and interpretation of advanced monitoring.    I personally spent 60 minutes providing critical care services including personally reviewing test results, discussing care with nursing staff/other physicians and completing orders pertaining to this patient.  Time was exclusive to the patient and does not include time spent teaching or in procedures.  Voice recognition software was used in the production of this record.  Errors in interpretation may have been inadvertently missed during review.  Francine Graven, MSN, AGACNP   [facility]

## 2019-05-22 NOTE — Progress Notes (Signed)
Mode changed d/t desynchrony on vent.  ABG will be drawn within one hour.

## 2019-05-22 NOTE — Progress Notes (Signed)
Head repositioned. Arms rotated. ETT remains secured. No issues.

## 2019-05-22 NOTE — Progress Notes (Signed)
ANTICOAGULATION CONSULT NOTE - Follow Up Consult  Pharmacy Consult for heparin Indication: r/o PE   Allergies  Allergen Reactions  . Cefuroxime Rash  . Sulfamethoxazole-Trimethoprim Itching  . Aloe     itch  . Cephalosporins Rash    Patient Measurements: Height: 4\' 11"  (149.9 cm) Weight: 176 lb 5.9 oz (80 kg) IBW/kg (Calculated) : 43.2 Heparin Dosing Weight: 60 kg   Vital Signs: Temp: 97.9 F (36.6 C) (11/27 1600) Temp Source: Oral (11/27 1600) BP: 126/70 (11/27 1600) Pulse Rate: 98 (11/27 1600)  Labs: Recent Labs    05/21/19 0350  05/21/19 2200  05/22/19 0400 05/22/19 0456 05/22/19 0639 05/22/19 1028 05/22/19 1520  HGB 8.6*   < >  --    < > 8.4* 9.2*  --  9.0*  --   HCT 29.6*   < >  --    < > 29.3* 27.0*  --  32.2*  --   PLT 187  --   --   --  169  --   --  187  --   HEPARINUNFRC  --    < > 0.10*  --   --   --  0.32  --  0.45  CREATININE 0.50  --   --   --  0.52  --   --  0.49  --    < > = values in this interval not displayed.    Estimated Creatinine Clearance: 63.2 mL/min (by C-G formula based on SCr of 0.49 mg/dL).  Assessment: 58 YOF admitted with COVID-19 pneumonia. Currently with worsening hypoxemia and concern for PE. Pharmacy consulted to start IV heparin.   Currently on IV heparin at 1250 units/hr with low therapeutic anti-xa level this AM. Per RN, some bloody oozing from mouth but no s/s of overt bleeding. H/H low stable, Plt wnl   Heparin level came back therapeutic again at 0.45. Continue heparin and watch for any further oozing.    Goal of Therapy:  Heparin level 0.3-0.7 units/ml Monitor platelets by anticoagulation protocol: Yes    Plan:  Cont IV heparin at 1300 units/hr Monitor daily HL, CBC and s/s of bleeding   Onnie Boer, PharmD, [place], AAHIVP, CPP Infectious Disease Pharmacist 05/22/2019 4:39 PM

## 2019-05-22 NOTE — Progress Notes (Signed)
ANTICOAGULATION CONSULT NOTE - Follow Up Consult  Pharmacy Consult for heparin Indication: r/o PE   Allergies  Allergen Reactions  . Cefuroxime Rash  . Sulfamethoxazole-Trimethoprim Itching  . Aloe     itch  . Cephalosporins Rash    Patient Measurements: Height: 4\' 11"  (149.9 cm) Weight: 176 lb 5.9 oz (80 kg) IBW/kg (Calculated) : 43.2 Heparin Dosing Weight: 60 kg   Vital Signs: Temp: 98.8 F (37.1 C) (11/27 0326) BP: 136/70 (11/27 0600) Pulse Rate: 89 (11/27 0600)  Labs: Recent Labs    05/20/19 0602  05/21/19 0350  05/21/19 1205  05/21/19 2200 05/22/19 0327 05/22/19 0400 05/22/19 0456 05/22/19 0639  HGB 8.6*   < > 8.6*  --   --    < >  --  9.2* 8.4* 9.2*  --   HCT 30.2*   < > 29.6*  --   --    < >  --  27.0* 29.3* 27.0*  --   PLT 177  --  187  --   --   --   --   --  169  --   --   HEPARINUNFRC 0.45  --   --    < > <0.10*  --  0.10*  --   --   --  0.32  CREATININE 0.50  --  0.50  --   --   --   --   --  0.52  --   --    < > = values in this interval not displayed.    Estimated Creatinine Clearance: 63.2 mL/min (by C-G formula based on SCr of 0.52 mg/dL).  Assessment: 46 YOF admitted with COVID-19 pneumonia. Currently with worsening hypoxemia and concern for PE. Pharmacy consulted to start IV heparin.   Currently on IV heparin at 1250 units/hr with low therapeutic anti-xa level this AM. Per RN, some bloody oozing from mouth but no s/s of overt bleeding. H/H low stable, Plt wnl   Goal of Therapy:  Heparin level 0.3-0.7 units/ml Monitor platelets by anticoagulation protocol: Yes    Plan:  Increase IV heparin to 1300 units/hr F/u 6 hr HL  Monitor daily HL, CBC and s/s of bleeding   Albertina Parr, PharmD., BCPS Clinical Pharmacist Clinical phone for 05/23/19 until 5pm: [phone]

## 2019-05-23 DIAGNOSIS — U071 COVID-19: Secondary | ICD-10-CM | POA: Diagnosis not present

## 2019-05-23 DIAGNOSIS — E099 Drug or chemical induced diabetes mellitus without complications: Secondary | ICD-10-CM | POA: Diagnosis not present

## 2019-05-23 DIAGNOSIS — Z9911 Dependence on respirator [ventilator] status: Secondary | ICD-10-CM | POA: Diagnosis not present

## 2019-05-23 DIAGNOSIS — Z7189 Other specified counseling: Secondary | ICD-10-CM

## 2019-05-23 DIAGNOSIS — J982 Interstitial emphysema: Secondary | ICD-10-CM | POA: Diagnosis not present

## 2019-05-23 LAB — CBC
HCT: 26.7 % — ABNORMAL LOW (ref 36.0–46.0)
Hemoglobin: 7.6 g/dL — ABNORMAL LOW (ref 12.0–15.0)
MCH: 26.9 pg (ref 26.0–34.0)
MCHC: 28.5 g/dL — ABNORMAL LOW (ref 30.0–36.0)
MCV: 94.3 fL (ref 80.0–100.0)
Platelets: 150 10*3/uL (ref 150–400)
RBC: 2.83 MIL/uL — ABNORMAL LOW (ref 3.87–5.11)
RDW: 18.6 % — ABNORMAL HIGH (ref 11.5–15.5)
WBC: 22.6 10*3/uL — ABNORMAL HIGH (ref 4.0–10.5)
nRBC: 0.6 % — ABNORMAL HIGH (ref 0.0–0.2)

## 2019-05-23 LAB — BASIC METABOLIC PANEL
Anion gap: 12 (ref 5–15)
BUN: 29 mg/dL — ABNORMAL HIGH (ref 8–23)
CO2: 41 mmol/L — ABNORMAL HIGH (ref 22–32)
Calcium: 8 mg/dL — ABNORMAL LOW (ref 8.9–10.3)
Chloride: 95 mmol/L — ABNORMAL LOW (ref 98–111)
Creatinine, Ser: 0.46 mg/dL (ref 0.44–1.00)
GFR calc Af Amer: >60 mL/min (ref >60)
GFR calc non Af Amer: >60 mL/min (ref >60)
Glucose, Bld: 100 mg/dL — ABNORMAL HIGH (ref 70–99)
Potassium: 3.4 mmol/L — ABNORMAL LOW (ref 3.5–5.1)
Sodium: 148 mmol/L — ABNORMAL HIGH (ref 135–145)

## 2019-05-23 LAB — MAGNESIUM: Magnesium: 2 mg/dL (ref 1.7–2.4)

## 2019-05-23 LAB — POCT I-STAT 7, (LYTES, BLD GAS, ICA,H+H)
Acid-Base Excess: 23 mmol/L — ABNORMAL HIGH (ref 0.0–2.0)
Bicarbonate: 49.8 mmol/L — ABNORMAL HIGH (ref 20.0–28.0)
Calcium, Ion: 1.14 mmol/L — ABNORMAL LOW (ref 1.15–1.40)
HCT: 34 % — ABNORMAL LOW (ref 36.0–46.0)
Hemoglobin: 11.6 g/dL — ABNORMAL LOW (ref 12.0–15.0)
O2 Saturation: 87 %
Potassium: 3.4 mmol/L — ABNORMAL LOW (ref 3.5–5.1)
Sodium: 140 mmol/L (ref 135–145)
TCO2: >50 mmol/L — ABNORMAL HIGH (ref 22–32)
pCO2 arterial: 64.3 mmHg — ABNORMAL HIGH (ref 32.0–48.0)
pH, Arterial: 7.497 — ABNORMAL HIGH (ref 7.350–7.450)
pO2, Arterial: 51 mmHg — ABNORMAL LOW (ref 83.0–108.0)

## 2019-05-23 LAB — GLUCOSE, CAPILLARY
Glucose-Capillary: 111 mg/dL — ABNORMAL HIGH (ref 70–99)
Glucose-Capillary: 105 mg/dL — ABNORMAL HIGH (ref 70–99)
Glucose-Capillary: 175 mg/dL — ABNORMAL HIGH (ref 70–99)
Glucose-Capillary: 226 mg/dL — ABNORMAL HIGH (ref 70–99)
Glucose-Capillary: 229 mg/dL — ABNORMAL HIGH (ref 70–99)

## 2019-05-23 LAB — HEPARIN LEVEL (UNFRACTIONATED)
Heparin Unfractionated: 1.28 IU/mL — ABNORMAL HIGH (ref 0.30–0.70)
Heparin Unfractionated: 1.58 IU/mL — ABNORMAL HIGH (ref 0.30–0.70)

## 2019-05-23 MED ORDER — INSULIN DETEMIR 100 UNIT/ML ~~LOC~~ SOLN
12.0000 [IU] | SUBCUTANEOUS | Status: DC
  Administered 2019-05-23 – 2019-05-24 (×2): 12 [IU] via SUBCUTANEOUS
  Filled 2019-05-23 (×2): qty 0.12

## 2019-05-23 MED ORDER — INSULIN DETEMIR 100 UNIT/ML ~~LOC~~ SOLN
20.0000 [IU] | Freq: Every day | SUBCUTANEOUS | Status: DC
  Administered 2019-05-23 – 2019-05-25 (×3): 20 [IU] via SUBCUTANEOUS
  Filled 2019-05-23 (×3): qty 0.2

## 2019-05-23 MED ORDER — HEPARIN (PORCINE) 25000 UT/250ML-% IV SOLN
1200.0000 [IU]/h | INTRAVENOUS | Status: DC
  Filled 2019-05-23: qty 250

## 2019-05-23 MED ORDER — FUROSEMIDE 10 MG/ML IJ SOLN
40.0000 mg | Freq: Once | INTRAMUSCULAR | Status: AC
  Administered 2019-05-23: 40 mg via INTRAVENOUS
  Filled 2019-05-23: qty 4

## 2019-05-23 NOTE — Progress Notes (Addendum)
0750: Assumed care of Pt from night RN. Sedated and paralyzed; Nimbex, heparin, dilaudid, versed infusing appropriately. Vent FiO2 90%. Foley and flexi seal in pace, tube feeds through Keo. Will monitor  1000: Heparin paused for high lab value. Will restart at lower rate per pharmacist  1230: MD at bedside, Nimbex stopped, will monitor  1600: Pt grimaces to pain now, otherwise does not follow commands. Heparin, dilaudid, versed infusing. Foley and flexi remain intact.

## 2019-05-23 NOTE — Progress Notes (Signed)
Heparin level came back high again. Suspected that this is not a true level. We will do a STAT repeat with a peripheral stick.  Onnie Boer, PharmD, BCIDP, AAHIVP, CPP Infectious Disease Pharmacist 05/23/2019 8:52 PM

## 2019-05-23 NOTE — Progress Notes (Signed)
ANTICOAGULATION CONSULT NOTE - Follow Up Consult  Pharmacy Consult for heparin Indication: r/o PE   Allergies  Allergen Reactions  . Cefuroxime Rash  . Sulfamethoxazole-Trimethoprim Itching  . Aloe     itch  . Cephalosporins Rash    Patient Measurements: Height: 4\' 11"  (149.9 cm) Weight: 177 lb 4 oz (80.4 kg) IBW/kg (Calculated) : 43.2 Heparin Dosing Weight: 60 kg   Vital Signs: Temp: 98.2 F (36.8 C) (11/28 0400) Temp Source: Oral (11/28 0400) BP: 102/57 (11/28 0700) Pulse Rate: 75 (11/28 0700)  Labs: Recent Labs    05/21/19 2200  05/22/19 0400 05/22/19 0456 05/22/19 0639 05/22/19 1028 05/22/19 1520 05/23/19 0549  HGB  --    < > 8.4* 9.2*  --  9.0*  --  7.6*  HCT  --    < > 29.3* 27.0*  --  32.2*  --  26.7*  PLT  --   --  169  --   --  187  --  150  HEPARINUNFRC 0.10*  --   --   --  0.32  --  0.45  --   CREATININE  --   --  0.52  --   --  0.49  --  0.46   < > = values in this interval not displayed.    Estimated Creatinine Clearance: 63.4 mL/min (by C-G formula based on SCr of 0.46 mg/dL).  Assessment: 38 YOF admitted with COVID-19 pneumonia. Currently with worsening hypoxemia and concern for PE. Pharmacy consulted to start IV heparin.   Currently on IV heparin at 1300 units/hr. HL has been relatively stable but has trended up significantly this AM. IV heparin running via peripheral line. Renal fx remains stable. H/H has trended down this AM. RN reports no more bloody oozing from mouth.   Goal of Therapy:  Heparin level 0.3-0.7 units/ml Monitor platelets by anticoagulation protocol: Yes    Plan:  Hold IV heparin x 1 hour, then restart IV heparin at 1200 units/hr F/u 6 hr HL after restart   Monitor daily HL, CBC and s/s of bleeding   Albertina Parr, PharmD., BCPS Clinical Pharmacist Clinical phone for 05/23/19 until 5pm: [phone]

## 2019-05-23 NOTE — Progress Notes (Signed)
NAME:  Teresa Barker, MRN:  [id number], DOB:  [birth year], LOS: 8 ADMISSION DATE:  05/19/2019, CONSULTATION DATE:  11/20 REFERRING MD:  Teresa Kay MD, CHIEF COMPLAINT:  COVID 19 pneumonia   Brief History   68 y/o female admitted 11/10 to [facility] for COVID pneumonia, intubated [facility] on 11/19, moved to [facility] ICU on 11/20.    Past Medical History  COPD DM2 Obesity Asthma GERD Hypertension Neuropathy  Significant Hospital Events   11/8- Admit to [facility] 11/19- Intubated, transfer to [facility] 11/21- New onset afib on admission > converted to NSR 11/22 pneumomediastinum and pneumopericardium seen on CT chest 11/23 Proning protocol initiated   Consults:   Procedures:  Femoral triple-lumen 11/19 outside hospital > 11/21 Rt PICC 11/21 >>  11/23>Bronch normal airways, no mucus plugging, no airway lesions.  Significant Diagnostic Tests:  11/22 CT chest images personally reviewed: pneumomediastinum and pneumopericardium noted, small bilateral pneumothoraces. Bilateral peripheral predominant ground glass opacification. 11/23 Echo EF 65 to 70%, LV hyperdynamic function.  Borderline LVH.  Indeterminate diastolic filling due to E-A fusion. Global RV systolic function normal.  RV systolic pressure 99991111 mmHg, moderately elevated. Trace MR. Mild TR. Mild calcification of AV. Moderately elevated pulmonary artery systolic pressure. Micro Data:  SARS COV2 11/7 at [facility] Positive Tracheal aspirate 11/24>> few GPC, few yeast, c/w normal respiratory flora MRSA surveillance negative  Antimicrobials/COVID19 Tx:  Completed remdesivir at [facility] Dexamethasone 11/20>> Pip/Tazo 11/25>>  Interim history/subjective:  Cut on her lip this morning, sedated & under NMB.   Objective   Blood pressure 121/72, pulse 66, temperature 98.1 F (36.7 C), temperature source Axillary, resp. rate (!) 35, height 4\' 11"  (1.499 m), weight 80.4 kg, SpO2 93 %.    Vent Mode: PRVC FiO2 (%):  [85 %-100 %]  100 % Set Rate:  [35 bmp] 35 bmp Vt Set:  [250 mL-300 mL] 300 mL PEEP:  [14 cmH20] 14 cmH20 Plateau Pressure:  [36 cmH20-40 cmH20] 36 cmH20   Intake/Output Summary (Last 24 hours) at 05/23/2019 1546 Last data filed at 05/23/2019 1100 Gross per 24 hour  Intake 1852.71 ml  Output 3240 ml  Net -1387.29 ml   Filed Weights   05/18/19 0500 05/21/19 0500 05/23/19 0500  Weight: 79.8 kg 80 kg 80.4 kg    Examination:  General: Elderly woman lying in bed intubated, sedated, under neuromuscular blockade  HENT: [place], eyes anicteric.  One cut on lip.  No other bruising or skin breakdown. CV: Regular rate and rhythm, no murmurs  lungs: Rales bilaterally, no rhonchi.  Elevated plateau and driving pressures ABD: Obese, soft, nontender GU: Foley draining clear yellow urine EXT: No peripheral edema or cyanosis Skin: No rashes or wounds Neuro: Heavily sedated and under neuromuscular blockade   Resolved Hospital Problem list     Assessment & Plan:  ARDS: Refractory hypoxemia, severe ventilator dyssynchrony Continues to require intermittent paralytic for vent synchrony and increasing sedation. Worsening P:F & lung compliance despite LPV. P:F 51 today.  -Con't LTVV, 4-8 cc/kg IBW with goal Pplateau <30 and driving pressure [id number].  We have not been able to meet these goals since 11/25 despite proning & NMB. Plateau today 9, driving pressure 22 on 30/300/14/100% -con't NMB as needed; lacrilube -con't heavy sedation to tolerate MV, vent synchrony, & NMB -prone again today -VAP prevention protocol -con't empiric zosyn -con't decadron; prev. completed remdesivir -negative fluid balance; lasix today   Sedation needs for ventilator synchrony -Continue fentanyl, Versed, phenobarbital for goal RASS -5 and vent  synchrony  Pneumomediastinum with pneumopericardium and small bilateral pneumothoraces. -Monitor clinically  Diabetes mellitus with hyperglycemia complicated by steroids-improved  control -Continue Accu-Cheks every 4 hours with sliding scale insulin as needed -Continue detemir and linagliptin -Goal BG 140-180 while admitted to the ICU  Atrial Fib-currently remaining in sinus rhythm with PACs and PVCs -Continue telemetry monitoring  Anemia-likely of critical illness and nutritional. No SnSx bleeding. On heparin -Continue to monitor -Transfuse hemoglobin less than 7 or active bleeding associated with hemodynamic instability  Best practice:  Diet: tube feeding Pain/Anxiety/Delirium protocol (if indicated): as above VAP protocol (if indicated): yes DVT prophylaxis: Heparin infusion GI prophylaxis: famotidine Glucose control: SSI, detemir and linagliptin Mobility: bed rest Code Status: DNR if cardiac arrest Family Communication: Updated daughter Teresa Barker and son-in-law Teresa Barker by phone.  Who had a lengthy discussion regarding my concerns for her worsening hypoxic respiratory failure, specifically as it relates to worsening compliance.  I am very concerned for ventilator induced lung injury, and I am worried that she is someone who will not be able to be liberated from the ventilator.  They understand this and are not interested in prolonging suffering, but they are struggling with the fear that they would potentially make her own decision.  We will continue to have ongoing goals of care discussions as we update her on her daily care. Disposition: Continue ICU  ABG    Component Value Date/Time   PHART 7.497 (H) 05/23/2019 1252   PCO2ART 64.3 (H) 05/23/2019 1252   PO2ART 51.0 (L) 05/23/2019 1252   HCO3 49.8 (H) 05/23/2019 1252   TCO2 >50 (H) 05/23/2019 1252   ACIDBASEDEF 1.0 05/14/2019 1149   O2SAT 87.0 05/23/2019 1252   on 30/300/14/100 with P plat 36  This patient is critically ill with multiple organ system failure which requires frequent high complexity decision making, assessment, support, evaluation, and titration of therapies. This was completed through the  application of advanced monitoring technologies and extensive interpretation of multiple databases. During this encounter critical care time was devoted to patient care services described in this note for 45 minutes.  Julian Hy, DO 05/23/19 4:27 PM [facility]

## 2019-05-24 DIAGNOSIS — U071 COVID-19: Secondary | ICD-10-CM | POA: Diagnosis not present

## 2019-05-24 DIAGNOSIS — J439 Emphysema, unspecified: Secondary | ICD-10-CM | POA: Diagnosis not present

## 2019-05-24 DIAGNOSIS — D649 Anemia, unspecified: Secondary | ICD-10-CM

## 2019-05-24 DIAGNOSIS — J9600 Acute respiratory failure, unspecified whether with hypoxia or hypercapnia: Secondary | ICD-10-CM | POA: Diagnosis not present

## 2019-05-24 LAB — BASIC METABOLIC PANEL
Anion gap: 8 (ref 5–15)
BUN: 32 mg/dL — ABNORMAL HIGH (ref 8–23)
CO2: 43 mmol/L — ABNORMAL HIGH (ref 22–32)
Calcium: 8 mg/dL — ABNORMAL LOW (ref 8.9–10.3)
Chloride: 94 mmol/L — ABNORMAL LOW (ref 98–111)
Creatinine, Ser: 0.43 mg/dL — ABNORMAL LOW (ref 0.44–1.00)
GFR calc Af Amer: >60 mL/min (ref >60)
GFR calc non Af Amer: >60 mL/min (ref >60)
Glucose, Bld: 136 mg/dL — ABNORMAL HIGH (ref 70–99)
Potassium: 3.1 mmol/L — ABNORMAL LOW (ref 3.5–5.1)
Sodium: 145 mmol/L (ref 135–145)

## 2019-05-24 LAB — GLUCOSE, CAPILLARY
Glucose-Capillary: 101 mg/dL — ABNORMAL HIGH (ref 70–99)
Glucose-Capillary: 155 mg/dL — ABNORMAL HIGH (ref 70–99)
Glucose-Capillary: 182 mg/dL — ABNORMAL HIGH (ref 70–99)
Glucose-Capillary: 302 mg/dL — ABNORMAL HIGH (ref 70–99)
Glucose-Capillary: 308 mg/dL — ABNORMAL HIGH (ref 70–99)

## 2019-05-24 LAB — MAGNESIUM: Magnesium: 2.1 mg/dL (ref 1.7–2.4)

## 2019-05-24 LAB — HEPARIN LEVEL (UNFRACTIONATED)
Heparin Unfractionated: 1.46 IU/mL — ABNORMAL HIGH (ref 0.30–0.70)
Heparin Unfractionated: 0.92 IU/mL — ABNORMAL HIGH (ref 0.30–0.70)
Heparin Unfractionated: 0.82 IU/mL — ABNORMAL HIGH (ref 0.30–0.70)

## 2019-05-24 MED ORDER — DEXAMETHASONE SODIUM PHOSPHATE 10 MG/ML IJ SOLN
6.0000 mg | Freq: Once | INTRAMUSCULAR | Status: AC
  Administered 2019-05-24: 6 mg via INTRAVENOUS
  Filled 2019-05-24: qty 1

## 2019-05-24 MED ORDER — POTASSIUM CHLORIDE 20 MEQ/15ML (10%) PO SOLN
40.0000 meq | Freq: Four times a day (QID) | ORAL | Status: AC
  Administered 2019-05-24 (×2): 40 meq
  Filled 2019-05-24 (×2): qty 30

## 2019-05-24 MED ORDER — HEPARIN (PORCINE) 25000 UT/250ML-% IV SOLN
650.0000 [IU]/h | INTRAVENOUS | Status: DC
  Administered 2019-05-24: 950 [IU]/h via INTRAVENOUS
  Administered 2019-05-25 – 2019-05-27 (×3): 650 [IU]/h via INTRAVENOUS
  Filled 2019-05-24 (×2): qty 250

## 2019-05-24 MED ORDER — FUROSEMIDE 10 MG/ML IJ SOLN
40.0000 mg | Freq: Once | INTRAMUSCULAR | Status: AC
  Administered 2019-05-24: 40 mg via INTRAVENOUS
  Filled 2019-05-24: qty 4

## 2019-05-24 NOTE — Plan of Care (Signed)

## 2019-05-24 NOTE — Progress Notes (Signed)
NAME:  Teresa Barker, MRN:  [id number], DOB:  [birth year], LOS: 9 ADMISSION DATE:  05/18/2019, CONSULTATION DATE:  11/20 REFERRING MD:  Derrill Kay MD, CHIEF COMPLAINT:  COVID 19 pneumonia   Brief History   68 y/o female admitted 11/10 to [facility] for COVID pneumonia, intubated [facility] on 11/19, moved to [facility] ICU on 11/20.    Past Medical History  COPD DM2 Obesity Asthma GERD Hypertension Neuropathy  Significant Hospital Events   11/8- Admit to [facility] 11/19- Intubated, transfer to [facility] 11/21- New onset afib on admission > converted to NSR 11/22 pneumomediastinum and pneumopericardium seen on CT chest 11/23 Proning protocol initiated   Consults:   Procedures:  Femoral triple-lumen 11/19 outside hospital > 11/21 Rt PICC 11/21 >>  11/23>Bronch normal airways, no mucus plugging, no airway lesions.  Significant Diagnostic Tests:  11/22 CT chest images personally reviewed: pneumomediastinum and pneumopericardium noted, small bilateral pneumothoraces. Bilateral peripheral predominant ground glass opacification. 11/23 Echo EF 65 to 70%, LV hyperdynamic function.  Borderline LVH.  Indeterminate diastolic filling due to E-A fusion. Global RV systolic function normal.  RV systolic pressure 99991111 mmHg, moderately elevated. Trace MR. Mild TR. Mild calcification of AV. Moderately elevated pulmonary artery systolic pressure. Micro Data:  SARS COV2 11/7 at [facility]- Positive Tracheal aspirate 11/24>> few GPC, few yeast, c/w normal respiratory flora MRSA surveillance negative  Antimicrobials/COVID19 Tx:  Completed remdesivir at [facility] Dexamethasone 11/20>> Pip/Tazo 11/25>>  Interim history/subjective:  Sedated, doing well with neuromuscular blockade turned off.   Objective   Blood pressure 137/71, pulse 70, temperature 98.8 F (37.1 C), temperature source Axillary, resp. rate (!) 35, height 4\' 11"  (1.499 m), weight 81 kg, SpO2 94 %.    Vent Mode: PRVC FiO2 (%):  [100  %] 100 % Set Rate:  [35 bmp] 35 bmp Vt Set:  [300 mL] 300 mL PEEP:  [12 cmH20-14 cmH20] 14 cmH20 Plateau Pressure:  [35 cmH20-40 cmH20] 35 cmH20   Intake/Output Summary (Last 24 hours) at 05/24/2019 2040 Last data filed at 05/24/2019 1830 Gross per 24 hour  Intake 594.69 ml  Output 1595 ml  Net -1000.31 ml   Filed Weights   05/21/19 0500 05/23/19 0500 05/24/19 0500  Weight: 80 kg 80.4 kg 81 kg    Examination:  General: Elderly woman lying in bed intubated, sedated.  Comfortable appearing. HENT: [place]/AT, eyes anicteric.  Some blood on her lower lip. CV: Regular rate and rhythm, no murmurs  lungs: Rales bilaterally, persistently elevated pressures on the ventilator.  Intermittent cuff leak.  ABD: Obese, soft, nontender GU: Foley draining clear yellow urine EXT: No peripheral edema or cyanosis Skin: No rashes.  Lower lip wound Neuro: Heavily sedated, pupils pinpoint.  Nonresponsive to verbal or physical stimulation.   Resolved Hospital Problem list     Assessment & Plan:  ARDS: Refractory hypoxemia, severe ventilator dyssynchrony Continues to require intermittent paralytic for vent synchrony and increasing sedation. Worsening P:F & lung compliance despite LPV.  -Continue LTV V, 4 to 8 cc/kg ideal body weight.  Goal plateau less than 30 and driving pressure less than 15, although we have been unable to achieve this in several days. -Continue heavy sedation with goal of vent synchrony.  Currently doing well without neuromuscular blockade -Proning stopped due to lack of response.  Occasionally had higher plateau pressures proned rather than supine. -VAP prevention protocol -Continue empiric Zosyn -Today was day 10 of Decadron.  Previously completed a 5-day course of remdesivir. -Maintain net negative fluid balance;  Lasix    Sedation needs for ventilator synchrony -Continue Dilaudid and Versed infusions.  Previously requiring phenobarbital, which has not been required since  changing fentanyl to Dilaudid.  Pneumomediastinum with pneumopericardium and small bilateral pneumothoraces. -Continue to monitor clinically  Diabetes mellitus with hyperglycemia complicated by steroids-improved control -Continue Accu-Cheks every 4 hours with sliding scale insulin as needed -Continue detemir and linagliptin -Goal BG 140-180 while admitted to the ICU  Atrial Fib-currently remaining in sinus rhythm with PACs and PVCs -Continue telemetry  Anemia-likely of critical illness and nutritional. No SnSx bleeding. On heparin.  Improving -Continue to monitor -Transfuse for hemoglobin less than 7 or active bleeding with hemodynamic instability  Best practice:  Diet: tube feeding Pain/Anxiety/Delirium protocol (if indicated): as above VAP protocol (if indicated): yes DVT prophylaxis: Heparin infusion GI prophylaxis: famotidine Glucose control: SSI, detemir and linagliptin Mobility: bed rest Code Status: DNR if cardiac arrest Family Communication:  daughter [name] and son-in-law [name] Disposition: Continue ICU  ABG    Component Value Date/Time   PHART 7.497 (H) 05/23/2019 1252   PCO2ART 64.3 (H) 05/23/2019 1252   PO2ART 51.0 (L) 05/23/2019 1252   HCO3 49.8 (H) 05/23/2019 1252   TCO2 >50 (H) 05/23/2019 1252   ACIDBASEDEF 1.0 05/18/2019 1149   O2SAT 87.0 05/23/2019 1252   on 30/300/14/100 with P plat 36  This patient is critically ill with multiple organ system failure which requires frequent high complexity decision making, assessment, support, evaluation, and titration of therapies. This was completed through the application of advanced monitoring technologies and extensive interpretation of multiple databases. During this encounter critical care time was devoted to patient care services described in this note for 35 minutes.  Julian Hy, DO 05/24/19 8:40 PM [facility]

## 2019-05-24 NOTE — Progress Notes (Signed)
ANTICOAGULATION CONSULT NOTE - Follow Up Consult  Pharmacy Consult for heparin Indication: r/o PE   Allergies  Allergen Reactions  . Cefuroxime Rash  . Sulfamethoxazole-Trimethoprim Itching  . Aloe     itch  . Cephalosporins Rash    Patient Measurements: Height: 4\' 11"  (149.9 cm) Weight: 177 lb 4 oz (80.4 kg) IBW/kg (Calculated) : 43.2 Heparin Dosing Weight: 60 kg   Vital Signs: Temp: 97.7 F (36.5 C) (11/28 1600) Temp Source: Oral (11/28 1600) BP: 108/56 (11/29 0000) Pulse Rate: 65 (11/29 0000)  Labs: Recent Labs    05/22/19 0400  05/22/19 1028  05/23/19 0549 05/23/19 1252 05/23/19 1655 05/23/19 2058  HGB 8.4*   < > 9.0*  --  7.6* 11.6*  --   --   HCT 29.3*   < > 32.2*  --  26.7* 34.0*  --   --   PLT 169  --  187  --  150  --   --   --   HEPARINUNFRC  --    < >  --    < > 1.28*  --  1.58* 1.46*  CREATININE 0.52  --  0.49  --  0.46  --   --   --    < > = values in this interval not displayed.    Estimated Creatinine Clearance: 63.4 mL/min (by C-G formula based on SCr of 0.46 mg/dL).  Assessment: 13 YOF admitted with COVID-19 pneumonia. Currently with worsening hypoxemia and concern for PE. Pharmacy consulted to start IV heparin.   Currently on IV heparin at 1200 units/hr. HL has been relatively stable but has trended up significantly today. IV heparin running via peripheral line. Heparin level 1.58 - suspected that was contaminated with heparin so STAT peripheral redrawn done - drawn correctly in arm opposite heparin. Repeat level remains elevated at 1.46. No bleeding noted per RN.  Goal of Therapy:  Heparin level 0.3-0.7 units/ml Monitor platelets by anticoagulation protocol: Yes    Plan:  Hold IV heparin x 1 hour, then restart IV heparin at 950 units/hr F/u 8 hr HL after restart    Sherlon Handing, PharmD, BCPS Please see amion for complete clinical pharmacist phone list 05/24/2019 12:36 AM

## 2019-05-24 NOTE — Progress Notes (Signed)
ANTICOAGULATION CONSULT NOTE - Follow Up Consult  Pharmacy Consult for heparin Indication: r/o PE   Allergies  Allergen Reactions  . Cefuroxime Rash  . Sulfamethoxazole-Trimethoprim Itching  . Aloe     itch  . Cephalosporins Rash    Patient Measurements: Height: 4\' 11"  (149.9 cm) Weight: 178 lb 9.2 oz (81 kg) IBW/kg (Calculated) : 43.2 Heparin Dosing Weight: 60 kg   Vital Signs: Temp: 97.9 F (36.6 C) (11/29 0400) Temp Source: Oral (11/29 0400) BP: 107/57 (11/29 0700) Pulse Rate: 63 (11/29 0700)  Labs: Recent Labs    05/22/19 0400  05/22/19 1028  05/23/19 0549 05/23/19 1252 05/23/19 1655 05/23/19 2058 05/24/19 0531 05/24/19 0845  HGB 8.4*   < > 9.0*  --  7.6* 11.6*  --   --   --   --   HCT 29.3*   < > 32.2*  --  26.7* 34.0*  --   --   --   --   PLT 169  --  187  --  150  --   --   --   --   --   HEPARINUNFRC  --    < >  --    < > 1.28*  --  1.58* 1.46*  --  0.92*  CREATININE 0.52  --  0.49  --  0.46  --   --   --  0.43*  --    < > = values in this interval not displayed.    Estimated Creatinine Clearance: 63.7 mL/min (A) (by C-G formula based on SCr of 0.43 mg/dL (L)).  Assessment: 83 YOF admitted with COVID-19 pneumonia. Currently with worsening hypoxemia and concern for PE. Pharmacy consulted to start IV heparin.   Currently on IV heparin at 950 units/hr. HL remains elevated at 0.92 today. H/h improved. Plt wnl. SCr wnl   Goal of Therapy:  Heparin level 0.3-0.7 units/ml Monitor platelets by anticoagulation protocol: Yes    Plan:  Decrease IV heparin to 800 units/hr  F/u 8 hr HL  Monitor CBC, renal fx, and s/s of bleeding   Albertina Parr, PharmD., BCPS Clinical Pharmacist Clinical phone for 05/24/19 until 5pm: [phone]

## 2019-05-24 NOTE — Progress Notes (Signed)
ANTICOAGULATION CONSULT NOTE - Follow Up Consult  Pharmacy Consult for heparin Indication: r/o PE   Allergies  Allergen Reactions  . Cefuroxime Rash  . Sulfamethoxazole-Trimethoprim Itching  . Aloe     itch  . Cephalosporins Rash    Patient Measurements: Height: 4\' 11"  (149.9 cm) Weight: 178 lb 9.2 oz (81 kg) IBW/kg (Calculated) : 43.2 Heparin Dosing Weight: 60 kg   Vital Signs: Temp: 98.1 F (36.7 C) (11/29 1200) Temp Source: Axillary (11/29 1200) BP: 125/67 (11/29 1408) Pulse Rate: 77 (11/29 1408)  Labs: Recent Labs    05/22/19 0400  05/22/19 1028  05/23/19 0549 05/23/19 1252  05/23/19 2058 05/24/19 0531 05/24/19 0845 05/24/19 1700  HGB 8.4*   < > 9.0*  --  7.6* 11.6*  --   --   --   --   --   HCT 29.3*   < > 32.2*  --  26.7* 34.0*  --   --   --   --   --   PLT 169  --  187  --  150  --   --   --   --   --   --   HEPARINUNFRC  --    < >  --    < > 1.28*  --    < > 1.46*  --  0.92* 0.82*  CREATININE 0.52  --  0.49  --  0.46  --   --   --  0.43*  --   --    < > = values in this interval not displayed.    Estimated Creatinine Clearance: 63.7 mL/min (A) (by C-G formula based on SCr of 0.43 mg/dL (L)).  Assessment: 82 YOF admitted with COVID-19 pneumonia. Currently with worsening hypoxemia and concern for PE. Pharmacy consulted to start IV heparin.   Heparin leve continue to be elevated for some reason. We are going to decrease it even further and recheck level tonight.   Goal of Therapy:  Heparin level 0.3-0.7 units/ml Monitor platelets by anticoagulation protocol: Yes    Plan:  Decrease IV heparin to 650 units/hr  F/u 8 hr HL  Monitor CBC, renal fx, and s/s of bleeding   Onnie Boer, PharmD, BCIDP, AAHIVP, CPP Infectious Disease Pharmacist 05/24/2019 6:55 PM

## 2019-05-25 ENCOUNTER — Inpatient Hospital Stay (HOSPITAL_COMMUNITY): Payer: Medicare HMO

## 2019-05-25 DIAGNOSIS — J439 Emphysema, unspecified: Secondary | ICD-10-CM

## 2019-05-25 DIAGNOSIS — L8990 Pressure ulcer of unspecified site, unspecified stage: Secondary | ICD-10-CM | POA: Insufficient documentation

## 2019-05-25 DIAGNOSIS — J1289 Other viral pneumonia: Secondary | ICD-10-CM | POA: Diagnosis not present

## 2019-05-25 DIAGNOSIS — U071 COVID-19: Principal | ICD-10-CM

## 2019-05-25 DIAGNOSIS — J9600 Acute respiratory failure, unspecified whether with hypoxia or hypercapnia: Secondary | ICD-10-CM

## 2019-05-25 DIAGNOSIS — J80 Acute respiratory distress syndrome: Secondary | ICD-10-CM

## 2019-05-25 LAB — POCT I-STAT 7, (LYTES, BLD GAS, ICA,H+H)
Acid-Base Excess: 14 mmol/L — ABNORMAL HIGH (ref 0.0–2.0)
Bicarbonate: 41.8 mmol/L — ABNORMAL HIGH (ref 20.0–28.0)
Calcium, Ion: 1.17 mmol/L (ref 1.15–1.40)
Calcium, Ion: 1.2 mmol/L (ref 1.15–1.40)
HCT: 31 % — ABNORMAL LOW (ref 36.0–46.0)
HCT: 29 % — ABNORMAL LOW (ref 36.0–46.0)
Hemoglobin: 10.5 g/dL — ABNORMAL LOW (ref 12.0–15.0)
Hemoglobin: 9.9 g/dL — ABNORMAL LOW (ref 12.0–15.0)
O2 Saturation: 77 %
Patient temperature: 98
Potassium: 4.8 mmol/L (ref 3.5–5.1)
Potassium: 3.4 mmol/L — ABNORMAL LOW (ref 3.5–5.1)
Sodium: 141 mmol/L (ref 135–145)
Sodium: 144 mmol/L (ref 135–145)
TCO2: 44 mmol/L — ABNORMAL HIGH (ref 22–32)
pCO2 arterial: >97 mmHg (ref 32.0–48.0)
pCO2 arterial: 68.3 mmHg (ref 32.0–48.0)
pH, Arterial: 7.224 — ABNORMAL LOW (ref 7.350–7.450)
pH, Arterial: 7.393 (ref 7.350–7.450)
pO2, Arterial: 76 mmHg — ABNORMAL LOW (ref 83.0–108.0)
pO2, Arterial: 43 mmHg — ABNORMAL LOW (ref 83.0–108.0)

## 2019-05-25 LAB — CBC
HCT: 27.3 % — ABNORMAL LOW (ref 36.0–46.0)
Hemoglobin: 7.5 g/dL — ABNORMAL LOW (ref 12.0–15.0)
MCH: 26.4 pg (ref 26.0–34.0)
MCHC: 27.5 g/dL — ABNORMAL LOW (ref 30.0–36.0)
MCV: 96.1 fL (ref 80.0–100.0)
Platelets: 171 10*3/uL (ref 150–400)
RBC: 2.84 MIL/uL — ABNORMAL LOW (ref 3.87–5.11)
RDW: 19.9 % — ABNORMAL HIGH (ref 11.5–15.5)
WBC: 17.6 10*3/uL — ABNORMAL HIGH (ref 4.0–10.5)
nRBC: 2.9 % — ABNORMAL HIGH (ref 0.0–0.2)

## 2019-05-25 LAB — GLUCOSE, CAPILLARY
Glucose-Capillary: 110 mg/dL — ABNORMAL HIGH (ref 70–99)
Glucose-Capillary: 116 mg/dL — ABNORMAL HIGH (ref 70–99)
Glucose-Capillary: 122 mg/dL — ABNORMAL HIGH (ref 70–99)
Glucose-Capillary: 132 mg/dL — ABNORMAL HIGH (ref 70–99)
Glucose-Capillary: 140 mg/dL — ABNORMAL HIGH (ref 70–99)
Glucose-Capillary: 141 mg/dL — ABNORMAL HIGH (ref 70–99)
Glucose-Capillary: 189 mg/dL — ABNORMAL HIGH (ref 70–99)
Glucose-Capillary: 311 mg/dL — ABNORMAL HIGH (ref 70–99)

## 2019-05-25 LAB — BASIC METABOLIC PANEL
Anion gap: 10 (ref 5–15)
BUN: 31 mg/dL — ABNORMAL HIGH (ref 8–23)
CO2: 40 mmol/L — ABNORMAL HIGH (ref 22–32)
Calcium: 8.5 mg/dL — ABNORMAL LOW (ref 8.9–10.3)
Chloride: 96 mmol/L — ABNORMAL LOW (ref 98–111)
Creatinine, Ser: 0.52 mg/dL (ref 0.44–1.00)
GFR calc Af Amer: >60 mL/min (ref >60)
GFR calc non Af Amer: >60 mL/min (ref >60)
Glucose, Bld: 121 mg/dL — ABNORMAL HIGH (ref 70–99)
Potassium: 4 mmol/L (ref 3.5–5.1)
Sodium: 146 mmol/L — ABNORMAL HIGH (ref 135–145)

## 2019-05-25 LAB — MAGNESIUM: Magnesium: 2.2 mg/dL (ref 1.7–2.4)

## 2019-05-25 LAB — HEPARIN LEVEL (UNFRACTIONATED)
Heparin Unfractionated: 0.58 IU/mL (ref 0.30–0.70)
Heparin Unfractionated: 0.6 IU/mL (ref 0.30–0.70)

## 2019-05-25 MED ORDER — MIDAZOLAM BOLUS VIA INFUSION
1.0000 mg | INTRAVENOUS | Status: DC | PRN
  Filled 2019-05-25: qty 2

## 2019-05-25 MED ORDER — VECURONIUM BROMIDE 10 MG IV SOLR
0.0000 ug/kg/min | INTRAVENOUS | Status: DC
  Administered 2019-05-25: 23:00:00 1.2 ug/kg/min via INTRAVENOUS
  Filled 2019-05-25 (×2): qty 100

## 2019-05-25 MED ORDER — PNEUMOCOCCAL VAC POLYVALENT 25 MCG/0.5ML IJ INJ
0.5000 mL | INJECTION | INTRAMUSCULAR | Status: DC | PRN
  Filled 2019-05-25: qty 0.5

## 2019-05-25 MED ORDER — ARTIFICIAL TEARS OPHTHALMIC OINT
1.0000 "application " | TOPICAL_OINTMENT | Freq: Three times a day (TID) | OPHTHALMIC | Status: DC
  Administered 2019-05-25: 1 via OPHTHALMIC
  Filled 2019-05-25: qty 3.5

## 2019-05-25 MED ORDER — MIDAZOLAM 50MG/50ML (1MG/ML) PREMIX INFUSION: 2.0000 mg/h | INTRAVENOUS | Status: DC

## 2019-05-25 MED ORDER — HYDROMORPHONE HCL 1 MG/ML IJ SOLN: 0.5000 mg | Freq: Once | INTRAMUSCULAR | Status: DC

## 2019-05-25 MED ORDER — HYDROMORPHONE BOLUS VIA INFUSION
0.2000 mg | INTRAVENOUS | Status: DC | PRN
  Filled 2019-05-25: qty 1

## 2019-05-25 MED ORDER — INSULIN DETEMIR 100 UNIT/ML ~~LOC~~ SOLN
15.0000 [IU] | Freq: Every day | SUBCUTANEOUS | Status: DC
  Administered 2019-05-26 – 2019-05-29 (×4): 15 [IU] via SUBCUTANEOUS
  Filled 2019-05-25 (×4): qty 0.15

## 2019-05-25 MED ORDER — SODIUM CHLORIDE 0.9 % IV SOLN
0.5000 mg/h | INTRAVENOUS | Status: DC
  Administered 2019-05-26 (×2): 4 mg/h via INTRAVENOUS
  Administered 2019-05-28: 2 mg/h via INTRAVENOUS
  Administered 2019-05-29 – 2019-05-30 (×2): 2.25 mg/h via INTRAVENOUS
  Filled 2019-05-25 (×6): qty 5

## 2019-05-25 MED ORDER — ARTIFICIAL TEARS OPHTHALMIC OINT: 1.0000 "application " | TOPICAL_OINTMENT | Freq: Three times a day (TID) | OPHTHALMIC | Status: DC

## 2019-05-25 MED ORDER — VECURONIUM BOLUS VIA INFUSION
0.0800 mg/kg | Freq: Once | INTRAVENOUS | Status: AC
  Administered 2019-05-25: 6.5 mg via INTRAVENOUS
  Filled 2019-05-25: qty 7

## 2019-05-25 MED ORDER — VECURONIUM BROMIDE 10 MG IV SOLR
10.0000 mg | Freq: Once | INTRAVENOUS | Status: AC
  Administered 2019-05-25: 10 mg via INTRAVENOUS

## 2019-05-25 MED ORDER — SODIUM CHLORIDE 0.9 % IV SOLN
0.5000 mg/h | INTRAVENOUS | Status: DC
  Administered 2019-05-25: 22:00:00 4 mg/h via INTRAVENOUS
  Filled 2019-05-25: qty 5

## 2019-05-25 MED ORDER — MIDAZOLAM BOLUS VIA INFUSION
1.0000 mg | INTRAVENOUS | Status: DC | PRN
  Administered 2019-05-26 – 2019-05-28 (×2): 2 mg via INTRAVENOUS
  Filled 2019-05-25: qty 2

## 2019-05-25 MED ORDER — ARTIFICIAL TEARS OPHTHALMIC OINT
1.0000 "application " | TOPICAL_OINTMENT | Freq: Three times a day (TID) | OPHTHALMIC | Status: DC
  Administered 2019-05-26 – 2019-05-30 (×12): 1 via OPHTHALMIC
  Filled 2019-05-25 (×2): qty 3.5

## 2019-05-25 MED ORDER — INFLUENZA VAC A&B SA ADJ QUAD 0.5 ML IM PRSY
0.5000 mL | PREFILLED_SYRINGE | INTRAMUSCULAR | Status: DC | PRN
  Filled 2019-05-25: qty 0.5

## 2019-05-25 MED ORDER — MIDAZOLAM 50MG/50ML (1MG/ML) PREMIX INFUSION
2.0000 mg/h | INTRAVENOUS | Status: DC
  Administered 2019-05-26 (×5): 10 mg/h via INTRAVENOUS
  Administered 2019-05-27: 03:00:00 6 mg/h via INTRAVENOUS
  Administered 2019-05-28 (×2): 2 mg/h via INTRAVENOUS
  Administered 2019-05-29 – 2019-05-30 (×2): 3 mg/h via INTRAVENOUS
  Filled 2019-05-25 (×10): qty 50

## 2019-05-25 NOTE — Progress Notes (Signed)
Report given to [place], Therapist, sports. Pt's o2 SAT 84%, HR:92; BP: 159/88; RESP: 35.

## 2019-05-25 NOTE — Progress Notes (Signed)
Pt's daughter, [name] updated on pt status at this time.

## 2019-05-25 NOTE — Procedures (Signed)
Intubation Procedure Note Teresa Barker [id number] 1950-10-25  Procedure: Intubation Indications: Respiratory insufficiency Cuff leak on old ETT  Procedure Details Consent: Risks of procedure as well as the alternatives and risks of each were explained to the (patient/caregiver).  Consent for procedure obtained. Time Out: Verified patient identification, verified procedure, site/side was marked, verified correct patient position, special equipment/implants available, medications/allergies/relevent history reviewed, required imaging and test results available.  Performed  Drugs Versed infusion, fentanyl infusion, vecuronium 45m IV DL x 1 with MAC 3 blade Grade 1 view 7.5 ET tube passed through cords under direct visualization Placement confirmed with bilateral breath sounds, positive EtCO2 change and smoke in tube   Evaluation Hemodynamic Status: BP stable throughout; O2 sats: transiently fell during during procedure Patient's Current Condition: stable Complications: No apparent complications Patient did tolerate procedure well. Chest X-ray ordered to verify placement.  CXR: pending.   DSimonne Maffucci11/30/2020

## 2019-05-25 NOTE — Progress Notes (Signed)
ETT changed by CCM due to recurrent cuff leaks.

## 2019-05-25 NOTE — Progress Notes (Signed)
ANTICOAGULATION CONSULT NOTE - Follow Up Consult  Pharmacy Consult for heparin Indication: r/o PE   Patient Measurements: Height: 4\' 11"  (149.9 cm) Weight: 178 lb 9.2 oz (81 kg) IBW/kg (Calculated) : 43.2 Heparin Dosing Weight: 60 kg   Vital Signs: Temp: 98.2 F (36.8 C) (11/30 0400) Temp Source: Oral (11/30 0400) BP: 112/61 (11/30 0400) Pulse Rate: 65 (11/30 0400)  Labs: Recent Labs    05/22/19 1028  05/23/19 0549 05/23/19 1252  05/24/19 0531 05/24/19 0845 05/24/19 1700 05/25/19 0220  HGB 9.0*  --  7.6* 11.6*  --   --   --   --  7.5*  HCT 32.2*  --  26.7* 34.0*  --   --   --   --  27.3*  PLT 187  --  150  --   --   --   --   --  171  HEPARINUNFRC  --    < > 1.28*  --    < >  --  0.92* 0.82* 0.58  CREATININE 0.49  --  0.46  --   --  0.43*  --   --  0.52   < > = values in this interval not displayed.    Estimated Creatinine Clearance: 63.7 mL/min (by C-G formula based on SCr of 0.52 mg/dL).  Assessment: 76 YOF admitted with COVID-19 pneumonia. Currently with worsening hypoxemia and concern for PE. Pharmacy consulted to start IV heparin.   Heparin level therapeutic this am  Hg 7.5, PTLC 171  Goal of Therapy:  Heparin level 0.3-0.7 units/ml Monitor platelets by anticoagulation protocol: Yes    Plan:  Continue heparin at 650 units/hr  F/u 6 hr HL to confirm Monitor CBC, renal fx, and s/s of bleeding   Thanks for allowing pharmacy to be a part of this patient's care.  Excell Seltzer, PharmD Clinical Pharmacist 05/25/2019 5:42 AM

## 2019-05-25 NOTE — Progress Notes (Signed)
ANTICOAGULATION CONSULT NOTE - Follow Up Consult  Pharmacy Consult for Heparin Indication: r/o PE, Afib   Allergies  Allergen Reactions  . Cefuroxime Rash  . Sulfamethoxazole-Trimethoprim Itching  . Aloe     itch  . Cephalosporins Rash    Patient Measurements: Height: 4\' 11"  (149.9 cm) Weight: 178 lb 9.2 oz (81 kg) IBW/kg (Calculated) : 43.2 Heparin Dosing Weight: 60kg  Vital Signs: Temp: 98.4 F (36.9 C) (11/30 1130) Temp Source: Axillary (11/30 1130) BP: 129/73 (11/30 1300) Pulse Rate: 66 (11/30 1300)  Labs: Recent Labs    05/23/19 0549 05/23/19 1252  05/24/19 0531  05/24/19 1700 05/25/19 0220 05/25/19 1240  HGB 7.6* 11.6*  --   --   --   --  7.5*  --   HCT 26.7* 34.0*  --   --   --   --  27.3*  --   PLT 150  --   --   --   --   --  171  --   HEPARINUNFRC 1.28*  --    < >  --    < > 0.82* 0.58 0.60  CREATININE 0.46  --   --  0.43*  --   --  0.52  --    < > = values in this interval not displayed.    Estimated Creatinine Clearance: 63.7 mL/min (by C-G formula based on SCr of 0.52 mg/dL).   Medications:  Infusions:  . sodium chloride Stopped (05/23/19 2200)  . feeding supplement (VITAL AF 1.2 CAL) 1,000 mL (05/23/19 1629)  . heparin 650 Units/hr (05/25/19 1345)  . HYDROmorphone 2 mg/hr (05/25/19 0700)  . midazolam 8 mg/hr (05/25/19 0927)  . piperacillin-tazobactam (ZOSYN)  IV 3.375 g (05/25/19 1457)    Assessment: 81 yoF admitted on 11/20 with COVID-19 pneumonia.  She had been on Lovenox for VTE prophylaxis.  Due to worsening hypoxemia and concern for PE, pharmacy was consulted to start IV heparin on 11/23.  Also has PAF, currently in NSR.    Heparin level 0.6 remains therapeutic on heparin at 650 units/hr.   - previously therapeutic on heparin at 700 units/hr, then went subtherapeutic so heparin dosing was increased, this resulted in supratherapeutic levels on 11/28 and 11/29. CBC: Hgb low/stable at 7.5 (previously 7.6 on 11/28, but 9 on 11/27).  Plt  wnl. No bleeding or complications reported.  Goal of Therapy:  Heparin level 0.3-0.7 units/ml Monitor platelets by anticoagulation protocol: Yes   Plan:  Continue heparin IV infusion at 650 units/hr Daily heparin level and CBC Continue to monitor H&H, s/s bleeding  Gretta Arab PharmD, BCPS Clinical pharmacist phone 7am- 5pm: [phone] 05/25/2019 2:07 PM

## 2019-05-25 NOTE — Progress Notes (Signed)
eLink Physician-Brief Progress Note Patient Name: Teresa Barker DOB: [birth year] MRN: [place]:[id number]   Date of Service  05/25/2019  HPI/Events of Note  Patient with ventilator asynchrony associated with desaturation requiring PRN NMB. Given doses at 16:45 PM, 7:30 PM and 9:40 PM. Last desaturation into 70's and only has recovered to 84%.   eICU Interventions  Will order continuous NMB per protocol.      Intervention Category Major Interventions: Respiratory failure - evaluation and management  Rhenda [place] 05/25/2019, 10:02 PM

## 2019-05-25 NOTE — Progress Notes (Signed)
Pt desatting at this time; CCM notified. Respiratory and charge RN at bedside.

## 2019-05-25 NOTE — Progress Notes (Signed)
NAME:  Teresa Barker, MRN:  [id number], DOB:  07-09-1950, LOS: 12 ADMISSION DATE:  05/07/2019, CONSULTATION DATE:  11/20 REFERRING MD:  Derrill Kay MD, CHIEF COMPLAINT:  COVID 19 pneumonia   Brief History   68 y/o female admitted 11/10 to [facility] for COVID pneumonia, intubated [facility] on 11/19, moved to [facility] ICU on 11/20.   Past Medical History  COPD DM2 Obesity Asthma GERD Hypertension Neuropathy  Significant Hospital Events   11/08 Admit to [facility] 11/19 Intubated, transfer to [facility] 11/21 New onset afib on admission > converted to NSR 11/22 Pneumomediastinum and pneumopericardium seen on CT chest 11/23 Proning protocol initiated  11/28 NMB stopped  11/30 ETT cuff blown, tube exchange planned.    Consults:    Procedures:  Femoral TLC 11/19 >> 11/21  R PICC 11/21 >>   Significant Diagnostic Tests:   CT Chest 11/22 >> moderate pneumopericardium, extensive pneumomediastinum, small bilateral pneumothoraces, bilateral peripheral GGO and consolidation involvement in >75% of lungs, small right pleural effusion  ECHO 11/23 >> EF 65 to 70%, LV hyperdynamic function.  Borderline LVH.  Indeterminate diastolic filling due to E-A fusion. Global RV systolic function normal.  RV systolic pressure 99991111 mmHg, moderately elevated. Trace MR. Mild TR. Mild calcification of AV. Moderately elevated pulmonary artery systolic pressure.  FOB 11/23 >> normal airways, no mucus plugging, no airway lesions  Micro Data:  SARS COV2 11/7 at [facility] >> Positive Tracheal aspirate 11/24 >> normal flora  MRSA PCR 11/20 >> negative  Antimicrobials/COVID19 Tx:  Completed remdesivir at [place] 11/20 >> Pip/Tazo 11/25 >>  Interim history/subjective:  RT reports pt not weaning, remains on full support with PEEP 14, FiO2 100%. On dilaudid + versed gtt's. Afebrile. I/O - net neg balance 5.6L  Objective   Blood pressure 113/63, pulse 67, temperature 97.9 F (36.6 C), temperature  source Axillary, resp. rate (!) 35, height 4\' 11"  (1.499 m), weight 81 kg, SpO2 (!) 89 %.    Vent Mode: PRVC FiO2 (%):  [80 %-100 %] 100 % Set Rate:  [35 bmp] 35 bmp Vt Set:  [300 mL] 300 mL PEEP:  [14 cmH20] 14 cmH20 Plateau Pressure:  [30 cmH20-40 cmH20] 30 cmH20   Intake/Output Summary (Last 24 hours) at 05/25/2019 0950 Last data filed at 05/25/2019 0730 Gross per 24 hour  Intake 1877.54 ml  Output 3085 ml  Net -1207.46 ml   Filed Weights   05/21/19 0500 05/23/19 0500 05/24/19 0500  Weight: 80 kg 80.4 kg 81 kg    Examination: General: small adult female, ill appearing on vent in NAD HEENT: MM pink/moist, poor dentition, bloody secretions on teeth, small ulceration on upper lip, pupils 27mm sluggish / reactive  Neuro: sedate, no response to verbal stimuli  CV: s1s2 RRR, SR on monitor 72, no m/r/g PULM:  Even/non-labored on vent, synchronous with vent settings, elevated PIP 39 /plateau pressures 36, driving pressure 22, intermittent cuff leak  GI: soft, bsx4 active  Extremities: warm/dry, no edema  Skin: no rashes or lesions   Resolved Hospital Problem list     Assessment & Plan:   ARDS Refractory Hypoxemic Respiratory Failure  Severe vent dyssynchrony, improved with sedation.  Requiring intermittent paralytic.  P/F ratio 51. Completed steroids 11/29.  S/p Remdesivir. Prone positioning stopped due to lack of response & higher plateau pressures when prone.  -continue low Vt ventilation, 4-8 cc/kg. Currently on 7cc -goal plateau pressure <30, driving pressure < 15  -continue heavy sedation with goal for vent  synchrony -follow intermittent CXR  -VAP prevention measures  -empiric zosyn  -hold lasix 11/30 with negative balance  -will plan for ETT exchange 11/30  Sedation needs for ventilator synchrony Previously required phenobarbital while on fentanyl -RASS Goal -4 with vent synchrony  -continue dilaudid, versed infusion   Pneumomediastinum with Pneumopericardium and  Small Bilateral Pneumothoraces. -monitor clinically   DM with Hyperglycemia  Elevated glucose in setting of steroid administration -glucose reduced off steroids, range 110-120 -reduce levemir to 15 units QD -stop PM levemir  -SSI, moderate scale -continue linagliptin  PAF Currently in SR -continue tele monitoring  -heparin gtt per pharmacy   Anemia Suspect component critical illness and nutritional. No bleeding.   -trend CBC  -transfuse per guidelines, Hgb <7 or with active bleeding   At Risk Malnutrition  -TF per nutrition   Best practice:  Diet: TF Pain/Anxiety/Delirium protocol (if indicated): as above VAP protocol (if indicated): yes DVT prophylaxis: Heparin infusion GI prophylaxis: famotidine Glucose control: SSI, detemir and linagliptin Mobility: BR Code Status: DNR if cardiac arrest Family Communication: Daughter [name]) called for update 11/30.  Reviewed tube exchange procedure with family and risks associated with procedure due to high vent needs. They are in agreement.  Discussed that they have been considering transition to comfort but would like to see if this will help before making that decision. They want to feel they have exhausted all possible support for her.  Disposition: Continue ICU   CC Time: 26 minutes     Noe Gens, MSN, NP-C [facility] 05/25/2019, 9:50 AM   Please see [email or web address] for pager details.

## 2019-05-26 DIAGNOSIS — R739 Hyperglycemia, unspecified: Secondary | ICD-10-CM

## 2019-05-26 DIAGNOSIS — U071 COVID-19: Secondary | ICD-10-CM | POA: Diagnosis not present

## 2019-05-26 DIAGNOSIS — J1289 Other viral pneumonia: Secondary | ICD-10-CM | POA: Diagnosis not present

## 2019-05-26 DIAGNOSIS — J9601 Acute respiratory failure with hypoxia: Secondary | ICD-10-CM

## 2019-05-26 DIAGNOSIS — J439 Emphysema, unspecified: Secondary | ICD-10-CM | POA: Diagnosis not present

## 2019-05-26 LAB — POCT I-STAT 7, (LYTES, BLD GAS, ICA,H+H)
Calcium, Ion: 1.23 mmol/L (ref 1.15–1.40)
Calcium, Ion: 1.23 mmol/L (ref 1.15–1.40)
HCT: 33 % — ABNORMAL LOW (ref 36.0–46.0)
HCT: 35 % — ABNORMAL LOW (ref 36.0–46.0)
Hemoglobin: 11.2 g/dL — ABNORMAL LOW (ref 12.0–15.0)
Hemoglobin: 11.9 g/dL — ABNORMAL LOW (ref 12.0–15.0)
Patient temperature: 100.3
Patient temperature: 100.3
Potassium: 4.2 mmol/L (ref 3.5–5.1)
Potassium: 4.3 mmol/L (ref 3.5–5.1)
Sodium: 143 mmol/L (ref 135–145)
Sodium: 143 mmol/L (ref 135–145)
pCO2 arterial: >97 mmHg (ref 32.0–48.0)
pCO2 arterial: >97 mmHg (ref 32.0–48.0)
pH, Arterial: 7.096 — CL (ref 7.350–7.450)
pH, Arterial: 7.193 — CL (ref 7.350–7.450)
pO2, Arterial: 59 mmHg — ABNORMAL LOW (ref 83.0–108.0)
pO2, Arterial: 74 mmHg — ABNORMAL LOW (ref 83.0–108.0)

## 2019-05-26 LAB — GLUCOSE, CAPILLARY
Glucose-Capillary: 103 mg/dL — ABNORMAL HIGH (ref 70–99)
Glucose-Capillary: 143 mg/dL — ABNORMAL HIGH (ref 70–99)
Glucose-Capillary: 153 mg/dL — ABNORMAL HIGH (ref 70–99)
Glucose-Capillary: 204 mg/dL — ABNORMAL HIGH (ref 70–99)
Glucose-Capillary: 240 mg/dL — ABNORMAL HIGH (ref 70–99)
Glucose-Capillary: 81 mg/dL (ref 70–99)

## 2019-05-26 LAB — BASIC METABOLIC PANEL
Anion gap: 9 (ref 5–15)
BUN: 34 mg/dL — ABNORMAL HIGH (ref 8–23)
CO2: 39 mmol/L — ABNORMAL HIGH (ref 22–32)
Calcium: 8.6 mg/dL — ABNORMAL LOW (ref 8.9–10.3)
Chloride: 98 mmol/L (ref 98–111)
Creatinine, Ser: 0.58 mg/dL (ref 0.44–1.00)
GFR calc Af Amer: >60 mL/min (ref >60)
GFR calc non Af Amer: >60 mL/min (ref >60)
Glucose, Bld: 248 mg/dL — ABNORMAL HIGH (ref 70–99)
Potassium: 3.9 mmol/L (ref 3.5–5.1)
Sodium: 146 mmol/L — ABNORMAL HIGH (ref 135–145)

## 2019-05-26 LAB — CBC
HCT: 35.6 % — ABNORMAL LOW (ref 36.0–46.0)
Hemoglobin: 9.3 g/dL — ABNORMAL LOW (ref 12.0–15.0)
MCH: 26.6 pg (ref 26.0–34.0)
MCHC: 26.1 g/dL — ABNORMAL LOW (ref 30.0–36.0)
MCV: 102 fL — ABNORMAL HIGH (ref 80.0–100.0)
Platelets: 254 10*3/uL (ref 150–400)
RBC: 3.49 MIL/uL — ABNORMAL LOW (ref 3.87–5.11)
RDW: 20.9 % — ABNORMAL HIGH (ref 11.5–15.5)
WBC: 29.2 10*3/uL — ABNORMAL HIGH (ref 4.0–10.5)
nRBC: 5.5 % — ABNORMAL HIGH (ref 0.0–0.2)

## 2019-05-26 LAB — HEPARIN LEVEL (UNFRACTIONATED): Heparin Unfractionated: 0.46 IU/mL (ref 0.30–0.70)

## 2019-05-26 MED ORDER — METOPROLOL TARTRATE 5 MG/5ML IV SOLN
2.5000 mg | INTRAVENOUS | Status: DC | PRN
  Administered 2019-05-26 – 2019-05-29 (×10): 2.5 mg via INTRAVENOUS
  Filled 2019-05-26 (×10): qty 5

## 2019-05-26 MED ORDER — OXYCODONE HCL 5 MG PO TABS
5.0000 mg | ORAL_TABLET | Freq: Four times a day (QID) | ORAL | Status: DC
  Administered 2019-05-26 – 2019-05-29 (×11): 5 mg
  Filled 2019-05-26 (×12): qty 1

## 2019-05-26 MED ORDER — VITAL AF 1.2 CAL PO LIQD
1000.0000 mL | ORAL | Status: DC
  Administered 2019-05-26 – 2019-05-30 (×5): 1000 mL

## 2019-05-26 MED ORDER — GABAPENTIN 250 MG/5ML PO SOLN
300.0000 mg | Freq: Two times a day (BID) | ORAL | Status: DC
  Administered 2019-05-26 – 2019-05-30 (×9): 300 mg
  Filled 2019-05-26 (×10): qty 6

## 2019-05-26 MED ORDER — CLONAZEPAM 0.5 MG PO TABS
0.5000 mg | ORAL_TABLET | Freq: Two times a day (BID) | ORAL | Status: DC
  Administered 2019-05-26 – 2019-05-29 (×7): 0.5 mg
  Filled 2019-05-26 (×7): qty 1

## 2019-05-26 NOTE — Progress Notes (Signed)
Patient's saturation at 74% at shift change with ventilator asynchrony.  Patient recently was rolled after giving bath. CCM, RT and CN made aware . ABG done (se results). Vecuronium push given. Saturation slowly recovered to 89%. Given another dose of vecuronium @2140 . CCM made aware. Vecuronium drip started. BIS monitoring started. @0100  pt's HR around 150's and is on atrial tachycardia. 2.5 mg metoprolol was given which dropped the HR to around 110's.

## 2019-05-26 NOTE — Progress Notes (Signed)
eLink Physician-Brief Progress Note Patient Name: CORRISA REPPOND DOB: [birth year] MRN: [id number]   Date of Service  05/26/2019  HPI/Events of Note  Sinus Tachycardia - HR = 153 and BP = 166/87 with MAP = 109. CVP = 15.   eICU Interventions  Will order: 1. Metoprolol 2.5 mg IV Q 3 hours PRN HR > 115.      Intervention Category Major Interventions: Arrhythmia - evaluation and management  Daaron Dimarco [place] 05/26/2019, 1:16 AM

## 2019-05-26 NOTE — Plan of Care (Signed)
  Problem: Nutrition: Goal: Adequate nutrition will be maintained Outcome: Progressing   Problem: Pain Managment: Goal: General experience of comfort will improve Outcome: Progressing   Problem: Safety: Goal: Ability to remain free from injury will improve Outcome: Progressing   Problem: Education: Goal: Knowledge of General Education information will improve Description: Including pain rating scale, medication(s)/side effects and non-pharmacologic comfort measures Outcome: Not Progressing   Problem: Health Behavior/Discharge Planning: Goal: Ability to manage health-related needs will improve Outcome: Not Progressing   Problem: Clinical Measurements: Goal: Ability to maintain clinical measurements within normal limits will improve Outcome: Not Progressing Goal: Will remain free from infection Outcome: Not Progressing Goal: Diagnostic test results will improve Outcome: Not Progressing Goal: Respiratory complications will improve Outcome: Not Progressing Goal: Cardiovascular complication will be avoided Outcome: Not Progressing   Problem: Activity: Goal: Risk for activity intolerance will decrease Outcome: Not Progressing   Problem: Coping: Goal: Level of anxiety will decrease Outcome: Not Progressing   Problem: Elimination: Goal: Will not experience complications related to bowel motility Outcome: Not Progressing Goal: Will not experience complications related to urinary retention Outcome: Not Progressing   Problem: Skin Integrity: Goal: Risk for impaired skin integrity will decrease Outcome: Not Progressing

## 2019-05-26 NOTE — Progress Notes (Signed)
ANTICOAGULATION CONSULT NOTE - Follow Up Consult  Pharmacy Consult for Heparin Indication: r/o PE, Afib   Allergies  Allergen Reactions  . Cefuroxime Rash  . Sulfamethoxazole-Trimethoprim Itching  . Aloe     itch  . Cephalosporins Rash    Patient Measurements: Height: 4\' 11"  (149.9 cm) Weight: 179 lb 10.8 oz (81.5 kg) IBW/kg (Calculated) : 43.2 Heparin Dosing Weight: 60kg  Vital Signs: Temp: 98.1 F (36.7 C) (12/01 0600) Temp Source: Oral (12/01 0600) BP: 125/71 (12/01 0700) Pulse Rate: 126 (12/01 0700)  Labs: Recent Labs    05/24/19 0531  05/25/19 0220 05/25/19 1240 05/25/19 1936 05/26/19 0330  HGB  --   --  7.5*  --  9.9* 9.3*  HCT  --   --  27.3*  --  29.0* 35.6*  PLT  --   --  171  --   --  254  HEPARINUNFRC  --    < > 0.58 0.60  --  0.46  CREATININE 0.43*  --  0.52  --   --  0.58   < > = values in this interval not displayed.    Estimated Creatinine Clearance: 63.9 mL/min (by C-G formula based on SCr of 0.58 mg/dL).   Medications:  Infusions:  . sodium chloride Stopped (05/23/19 2200)  . feeding supplement (VITAL AF 1.2 CAL) 20 mL/hr at 05/26/19 0049  . heparin 650 Units/hr (05/26/19 0700)  . HYDROmorphone 4 mg/hr (05/26/19 0700)  . midazolam 10 mg/hr (05/26/19 0700)  . vecuronium (NORCURON) infusion 1.2 mcg/kg/min (05/26/19 0700)    Assessment: 56 yoF admitted on 11/20 with COVID-19 pneumonia.  She had been on Lovenox for VTE prophylaxis.  Due to worsening hypoxemia and concern for PE, pharmacy was consulted to start IV heparin on 11/23.  Also has PAF, has remained mostly NSR, with SV tachy overnight.  Heparin level 0.46 remains therapeutic on heparin at 650 units/hr.   - previously therapeutic on heparin at 700 units/hr, then went subtherapeutic so heparin dosing was increased, this resulted in supratherapeutic levels on 11/28 and 11/29. CBC: Hgb low/improved at 9.3 (previously 9, 7.6, 7.5) Plt wnl. No bleeding or complications reported.  Goal of  Therapy:  Heparin level 0.3-0.7 units/ml Monitor platelets by anticoagulation protocol: Yes   Plan:  Continue heparin IV infusion at 650 units/hr Daily heparin level and CBC Continue to monitor H&H, s/s bleeding  Gretta Arab PharmD, BCPS Clinical pharmacist phone 7am- 5pm: [phone] 05/26/2019 7:37 AM

## 2019-05-26 NOTE — Progress Notes (Signed)
Called and updated Pt's Daughter, [name].  Pt off paralytic and decreasing IV Sedation of Versed/Dialudid.  Started pt back on some home meds as well today.  VSS, sats remain > 91%.

## 2019-05-26 NOTE — Progress Notes (Signed)
Lavaged with 10 cc saline flush per MD order. Scant amount of secretions (tan) suctioned out. Pt stable throughout with no complications.

## 2019-05-26 NOTE — Progress Notes (Signed)
eLink Physician-Brief Progress Note Patient Name: Teresa Barker DOB: Nov 04, 1950 MRN: [id number]   Date of Service  05/26/2019  HPI/Events of Note  ABG on 100%/PRVC 35/TV 300/P 16 = 7.20/>97/59. Ppeak = 55. Not much room (if any) to push TV or PRVC rate up d/t Ppeak.   eICU Interventions  Continue present management.      Intervention Category Major Interventions: Respiratory failure - evaluation and management  Sommer,Steven Eugene 05/26/2019, 5:41 AM

## 2019-05-26 NOTE — Progress Notes (Signed)
NAME:  Teresa Barker, MRN:  [id number], DOB:  08/25/1950, LOS: 58 ADMISSION DATE:  05/14/2019, CONSULTATION DATE:  11/20 REFERRING MD:  Derrill Kay MD, CHIEF COMPLAINT:  COVID 19 pneumonia   Brief History   68 y/o female admitted 11/10 to [facility] for COVID pneumonia, intubated [facility] R on 11/19, moved to [facility] ICU on 11/20.   Past Medical History  COPD DM2 Obesity Asthma GERD Hypertension Neuropathy  Significant Hospital Events   11/08 Admit to [facility] 11/19 Intubated, transfer to [facility] 11/21 New onset afib on admission > converted to NSR 11/22 Pneumomediastinum and pneumopericardium seen on CT chest 11/23 Proning protocol initiated  11/28 NMB stopped  11/30 ETT cuff blown, tube exchanged.    Consults:    Procedures:  ETT 11/19 >> 11/30 (exchange for blown cuff), 11/30 >> Femoral TLC 11/19 >> 11/21  R PICC 11/21 >>   Significant Diagnostic Tests:   CT Chest 11/22 >> moderate pneumopericardium, extensive pneumomediastinum, small bilateral pneumothoraces, bilateral peripheral GGO and consolidation involvement in >75% of lungs, small right pleural effusion  ECHO 11/23 >> EF 65 to 70%, LV hyperdynamic function.  Borderline LVH.  Indeterminate diastolic filling due to E-A fusion. Global RV systolic function normal.  RV systolic pressure 99991111 mmHg, moderately elevated. Trace MR. Mild TR. Mild calcification of AV. Moderately elevated pulmonary artery systolic pressure.  FOB 11/23 >> normal airways, no mucus plugging, no airway lesions  Micro Data:  SARS COV2 11/7 at [facility] >> Positive Tracheal aspirate 11/24 >> normal flora  MRSA PCR 11/20 >> negative  Antimicrobials/COVID19 Tx:  Completed remdesivir at [facility] Dexamethasone 11/20 >> 11/29 Pip/Tazo 11/25 >> 11/30  Interim history/subjective:  RN reports paralytics restarted overnight.  Glucose range 100-300. Tmax 100.4.  Objective   Blood pressure 102/66, pulse (!) 112, temperature 99 F (37.2 C), temperature  source Axillary, resp. rate (!) 35, height 4\' 11"  (1.499 m), weight 81.5 kg, SpO2 (!) 89 %. CVP:  [15 mmHg] 15 mmHg  Vent Mode: PRVC FiO2 (%):  [90 %-100 %] 100 % Set Rate:  [35 bmp] 35 bmp Vt Set:  [300 mL] 300 mL PEEP:  [14 cmH20-16 cmH20] 16 cmH20 Plateau Pressure:  [41 cmH20-55 cmH20] 45 cmH20   Intake/Output Summary (Last 24 hours) at 05/26/2019 1409 Last data filed at 05/26/2019 1300 Gross per 24 hour  Intake 1555.06 ml  Output 1884 ml  Net -328.94 ml   Filed Weights   05/23/19 0500 05/24/19 0500 05/26/19 0327  Weight: 80.4 kg 81 kg 81.5 kg    Examination: General: small adult female lying in bed, critically ill appearing on vent  HEENT: MM pink/moist, ETT, small amt bloody secretions on teeth /lips, small ulceration on upper lip Neuro: sedate, paralyzed  CV: s1s2 RRR, no m/r/g, radial pulses +2 PULM:  Even/non-labored, lungs bilaterally coarse, no wheezing GI: soft, bsx4 active, tolerating TF Extremities: warm/dry, no edema  Skin: no rashes or lesions   Resolved Hospital Problem list     Assessment & Plan:   ARDS Refractory Hypoxemic Respiratory Failure  Severe vent dyssynchrony, improved with sedation.  Requiring intermittent paralytic.  P/F ratio 51. Completed steroids 11/29.  S/p Remdesivir. Prone positioning stopped due to lack of response & higher plateau pressures when prone.  -continue low Vt ventilation, 4-8cc/kg -goal plateau pressure <30, driving pressure [id number] -continue sedation with goal for vent synchrony  -follow intermittent CXR  -monitor off abx -remains in negative balance, hold lasix  Sedation needs for ventilator synchrony Previously required phenobarbital  while on fentanyl -RASS Goal -4 with vent synchrony -continue dilaudid, versed infusions -PRN paralytics if needed > note assessment of pressures on 11/30 were unchanged on or off paralytics.   Pneumomediastinum with Pneumopericardium and Small Bilateral Pneumothoraces. -monitor clinically    DM with Hyperglycemia  Elevated glucose in setting of steroid administration -glucose control improved off steroids, range 120-240 -continue levemir 15 units QD -moderate scale SSI  -continue linagliptin   PAF Currently in SR -tele monitoring  -heparin gtt per pharmacy   Anemia Suspect component critical illness and nutritional. No bleeding.   -trend CBC -transfuse for Hgb <7%  At Risk Malnutrition  -TF per Nutrition   Best practice:  Diet: TF Pain/Anxiety/Delirium protocol (if indicated): as above VAP protocol (if indicated): yes DVT prophylaxis: Heparin infusion GI prophylaxis: famotidine Glucose control: SSI, detemir and linagliptin Mobility: BR Code Status: DNR if cardiac arrest Family Communication: Daughter [name]) called for update 12/1. Son-in-law Octavia Bruckner present for conversation.  Reviewed that at this point, we are dealing with single organ failure.  If she should show other decline they would be open to comfort measures.  Limitations in place in terms of DNR in the event of arrest.  Will continue supportive care.  Disposition: Continue ICU   CC Time: 30 minutes     Noe Gens, MSN, NP-C [facility] 05/26/2019, 2:09 PM   Please see [email or web address] for pager details.

## 2019-05-26 NOTE — Plan of Care (Signed)
Pt sedated and paralyzed, unable to update on POC.

## 2019-05-26 NOTE — Progress Notes (Signed)
Nutrition Follow-up RD working remotely.  DOCUMENTATION CODES:   Obesity unspecified  INTERVENTION:   Continue TF via Cortrak tube:   Increase Vital AF 1.2 to 55 ml/h (1320 ml per day)   D/C Pro-stat    Provides 1584 kcal, 99 gm protein, 1071 ml free water daily  NUTRITION DIAGNOSIS:   Increased nutrient needs related to acute illness(COVID PNA) as evidenced by estimated needs.  Ongoing  GOAL:   Patient will meet greater than or equal to 90% of their needs  Met with TF  MONITOR:   TF tolerance, Vent status, Labs, Skin  ASSESSMENT:   68 yo female admitted with COVID 19 PNA. PMH includes COPD, DM, obesity, asthma, GERD, HTN, neuropathy.   Poor oxygenation with severe ARDS. Prognosis is poor. Full support continues for now.   Patient remains intubated on ventilator support MV: 9.3 L/min Temp (24hrs), Avg:98.7 F (37.1 C), Min:98.1 F (36.7 C), Max:100.4 F (38 C)   Cortrak tube placed 11/25, tip is postpyloric. Receiving Vital AF 1.2 at 40 ml/h with Pro-stat 30 ml TID. Tolerating well. TF has been infusing at 20 ml/h during paralyzation with Nimbex. Nimbex discontinued today.   Labs reviewed.  CBG's: [id number]  Medications reviewed and include decadron, novolog, levemir, tradjenta, vitamin C, zinc sulfate.   Admission weight 78.8 kg, 81.5 kg today. I/O -5.9 L   Diet Order:   Diet Order            Diet NPO time specified  Diet effective now              EDUCATION NEEDS:   Not appropriate for education at this time  Skin:  Skin Assessment: Skin Integrity Issues: Skin Integrity Issues:: DTI DTI: sacrum Other: N/A  Last BM:  12/1  Height:   Ht Readings from Last 1 Encounters:  05/18/19 _0  (1.499 m)    Weight:   Wt Readings from Last 1 Encounters:  05/26/19 81.5 kg    Ideal Body Weight:  44.7 kg  BMI:  Body mass index is 36.29 kg/m.  Estimated Nutritional Needs:   Kcal:  1550-1800  Protein:  >/= 89 gm  Fluid:  >/=  1.5 L    Molli Barrows, RD, LDN, [place] Pager [phone] After Hours Pager [phone]

## 2019-05-26 NOTE — Progress Notes (Signed)
eLink Physician-Brief Progress Note Patient Name: Teresa Barker DOB: [birth year] MRN: [id number]   Date of Service  05/26/2019  HPI/Events of Note  Sinus Tachycardia - No fever. Last Hgb = 9.9.   eICU Interventions  Will order: 1. Monitor CVP now and Q 4 hours.      Intervention Category Major Interventions: Arrhythmia - evaluation and management  Sommer,Steven Eugene 05/26/2019, 1:01 AM

## 2019-05-26 NOTE — Progress Notes (Signed)
Daughter updated of patient's present condition and treatments that were initiated. All questions were answered.

## 2019-05-26 DEATH — deceased

## 2019-05-27 DIAGNOSIS — J1289 Other viral pneumonia: Secondary | ICD-10-CM | POA: Diagnosis not present

## 2019-05-27 DIAGNOSIS — J9601 Acute respiratory failure with hypoxia: Secondary | ICD-10-CM | POA: Diagnosis not present

## 2019-05-27 DIAGNOSIS — J439 Emphysema, unspecified: Secondary | ICD-10-CM | POA: Diagnosis not present

## 2019-05-27 DIAGNOSIS — U071 COVID-19: Secondary | ICD-10-CM | POA: Diagnosis not present

## 2019-05-27 LAB — BASIC METABOLIC PANEL
Anion gap: 7 (ref 5–15)
BUN: 31 mg/dL — ABNORMAL HIGH (ref 8–23)
CO2: 37 mmol/L — ABNORMAL HIGH (ref 22–32)
Calcium: 8.3 mg/dL — ABNORMAL LOW (ref 8.9–10.3)
Chloride: 100 mmol/L (ref 98–111)
Creatinine, Ser: 0.48 mg/dL (ref 0.44–1.00)
GFR calc Af Amer: >60 mL/min (ref >60)
GFR calc non Af Amer: >60 mL/min (ref >60)
Glucose, Bld: 193 mg/dL — ABNORMAL HIGH (ref 70–99)
Potassium: 3.7 mmol/L (ref 3.5–5.1)
Sodium: 144 mmol/L (ref 135–145)

## 2019-05-27 LAB — GLUCOSE, CAPILLARY
Glucose-Capillary: 170 mg/dL — ABNORMAL HIGH (ref 70–99)
Glucose-Capillary: 184 mg/dL — ABNORMAL HIGH (ref 70–99)
Glucose-Capillary: 188 mg/dL — ABNORMAL HIGH (ref 70–99)
Glucose-Capillary: 190 mg/dL — ABNORMAL HIGH (ref 70–99)
Glucose-Capillary: 191 mg/dL — ABNORMAL HIGH (ref 70–99)
Glucose-Capillary: 201 mg/dL — ABNORMAL HIGH (ref 70–99)
Glucose-Capillary: 257 mg/dL — ABNORMAL HIGH (ref 70–99)

## 2019-05-27 LAB — CBC
HCT: 29.7 % — ABNORMAL LOW (ref 36.0–46.0)
Hemoglobin: 7.9 g/dL — ABNORMAL LOW (ref 12.0–15.0)
MCH: 26.8 pg (ref 26.0–34.0)
MCHC: 26.6 g/dL — ABNORMAL LOW (ref 30.0–36.0)
MCV: 100.7 fL — ABNORMAL HIGH (ref 80.0–100.0)
Platelets: 213 10*3/uL (ref 150–400)
RBC: 2.95 MIL/uL — ABNORMAL LOW (ref 3.87–5.11)
RDW: 21.1 % — ABNORMAL HIGH (ref 11.5–15.5)
WBC: 19.1 10*3/uL — ABNORMAL HIGH (ref 4.0–10.5)
nRBC: 2.4 % — ABNORMAL HIGH (ref 0.0–0.2)

## 2019-05-27 LAB — HEPARIN LEVEL (UNFRACTIONATED): Heparin Unfractionated: 0.31 IU/mL (ref 0.30–0.70)

## 2019-05-27 NOTE — Progress Notes (Signed)
Updated patients daughter and husband at 66. Ellamae Sia

## 2019-05-27 NOTE — Progress Notes (Signed)
Attending: discussed with Teresa Barker, see her note from today    Subjective: Remains mechanically ventilated High pressure on vent   Objective: Vitals:   05/27/19 1030 05/27/19 1200 05/27/19 1259 05/27/19 1400  BP: (!) 105/58 107/67 122/70 126/67  Pulse: (!) 117 (!) 118 (!) 117 (!) 121  Resp: (!) 29 (!) 23 19 16   Temp:  99 F (37.2 C)    TempSrc:  Oral    SpO2: (!) 89% 90% (!) 87% (!) 89%  Weight:      Height:       Vent Mode: PRVC FiO2 (%):  [100 %] 100 % Set Rate:  [35 bmp] 35 bmp Vt Set:  [300 mL] 300 mL PEEP:  [16 cmH20-18 cmH20] 18 cmH20 Plateau Pressure:  [30 cmH20-41 cmH20] 35 cmH20  Intake/Output Summary (Last 24 hours) at 05/27/2019 1548 Last data filed at 05/27/2019 1200 Gross per 24 hour  Intake 1721.39 ml  Output 1037 ml  Net 684.39 ml   General:  In bed on vent HENT: NCAT ETT in place PULM: CTA B, vent supported breathing CV: RRR, no mgr GI: BS+, soft, nontender MSK: normal bulk and tone Neuro: sedated on vent    CBC    Component Value Date/Time   WBC 19.1 (H) 05/27/2019 0330   RBC 2.95 (L) 05/27/2019 0330   HGB 7.9 (L) 05/27/2019 0330   HCT 29.7 (L) 05/27/2019 0330   PLT 213 05/27/2019 0330   MCV 100.7 (H) 05/27/2019 0330   MCH 26.8 05/27/2019 0330   MCHC 26.6 (L) 05/27/2019 0330   RDW 21.1 (H) 05/27/2019 0330   LYMPHSABS 0.8 05/20/2019 0602   MONOABS 1.1 (H) 05/20/2019 0602   EOSABS 0.0 05/20/2019 0602   BASOSABS 0.1 05/20/2019 0602    BMET    Component Value Date/Time   NA 144 05/27/2019 0330   K 3.7 05/27/2019 0330   CL 100 05/27/2019 0330   CO2 37 (H) 05/27/2019 0330   GLUCOSE 193 (H) 05/27/2019 0330   BUN 31 (H) 05/27/2019 0330   CREATININE 0.48 05/27/2019 0330   CALCIUM 8.3 (L) 05/27/2019 0330   GFRNONAA >60 05/27/2019 0330   GFRAA >60 05/27/2019 0330    CXR images personally reviewed> bilateral interstitial infiltrates, ETT in place  Impression/Plan:  ARDS: fibroproliferative phase, high pulmonary pressures with  vent undoubtedly contributing to lung injury. Check abg, see if we have room to decrease TVol.  I doubt we do as she has become dangerously acidemic with other changes.  Prognosis poor but I still think there is a chance of survival as we have seen this with many other patients.  If family agreeable we will continue to support with full mechanical ventilator support  My cc time 35 minutes  Roselie Awkward, MD [facility] Pager: [phone] Cell: [phone] After 3pm or if no response, call [phone]

## 2019-05-27 NOTE — Progress Notes (Signed)
Pt has not voided since foley dc at 1000. Bladder scan for 282 cc. Continue to monitor. Pure wick in place. Teresa Barker

## 2019-05-27 NOTE — Plan of Care (Signed)
  Problem: Nutrition: Goal: Adequate nutrition will be maintained 05/27/2019 1849 by Etta Quill, RN Outcome: Progressing 05/27/2019 1847 by Etta Quill, RN Outcome: Not Progressing   Problem: Pain Managment: Goal: General experience of comfort will improve 05/27/2019 1849 by Etta Quill, RN Outcome: Progressing 05/27/2019 1847 by Etta Quill, RN Outcome: Progressing   Problem: Safety: Goal: Ability to remain free from injury will improve 05/27/2019 1849 by Etta Quill, RN Outcome: Progressing 05/27/2019 1847 by Etta Quill, RN Outcome: Progressing   Problem: Skin Integrity: Goal: Risk for impaired skin integrity will decrease 05/27/2019 1849 by Etta Quill, RN Outcome: Progressing 05/27/2019 1847 by Etta Quill, RN Outcome: Progressing

## 2019-05-27 NOTE — Progress Notes (Signed)
NAME:  Teresa Barker, MRN:  [id number], DOB:  October 12, 1950, LOS: 45 ADMISSION DATE:  04/30/2019, CONSULTATION DATE:  11/20 REFERRING MD:  Derrill Kay MD, CHIEF COMPLAINT:  COVID 19 pneumonia   Brief History   68 y/o female admitted 11/10 to [facility] for COVID pneumonia, intubated [facility] on 11/19, moved to [facility] ICU on 11/20.   Past Medical History  COPD DM2 Obesity Asthma GERD Hypertension Neuropathy Prior intubation - late 90's, on vent for 3 weeks for COPD  [place] Hospital Events   11/08 Admit to [facility] 11/19 Intubated, transfer to [facility] 11/21 New onset afib on admission > converted to NSR 11/22 Pneumomediastinum and pneumopericardium seen on CT chest 11/23 Proning protocol initiated  11/28 NMB stopped  11/30 ETT cuff blown, tube exchanged.   12/02 PEEP/FiO2 requirements remain unchanged  Consults:    Procedures:  ETT 11/19 >> 11/30 (exchange for blown cuff), 11/30 >> Femoral TLC 11/19 >> 11/21  R PICC 11/21 >>   Significant Diagnostic Tests:   CT Chest 11/22 >> moderate pneumopericardium, extensive pneumomediastinum, small bilateral pneumothoraces, bilateral peripheral GGO and consolidation involvement in >75% of lungs, small right pleural effusion  ECHO 11/23 >> EF 65 to 70%, LV hyperdynamic function.  Borderline LVH.  Indeterminate diastolic filling due to E-A fusion. Global RV systolic function normal.  RV systolic pressure 99991111 mmHg, moderately elevated. Trace MR. Mild TR. Mild calcification of AV. Moderately elevated pulmonary artery systolic pressure.  FOB 11/23 >> normal airways, no mucus plugging, no airway lesions  Micro Data:  SARS COV2 11/7 at [facility] >> Positive Tracheal aspirate 11/24 >> normal flora  MRSA PCR 11/20 >> negative  Antimicrobials/COVID19 Tx:  Completed remdesivir at [facility] Dexamethasone 11/20 >> 11/29 Pip/Tazo 11/25 >> 11/30  Interim history/subjective:  RN / RT report no change overnight.  Remains on high PEEP/FiO2.  RN  reducing sedation. Tmax 99.    Objective   Blood pressure 126/67, pulse (!) 121, temperature 99 F (37.2 C), temperature source Oral, resp. rate 16, height 4\' 11"  (1.499 m), weight 81 kg, SpO2 (!) 89 %. CVP:  [8 mmHg-21 mmHg] 19 mmHg  Vent Mode: PRVC FiO2 (%):  [100 %] 100 % Set Rate:  [35 bmp] 35 bmp Vt Set:  [300 mL] 300 mL PEEP:  [16 cmH20-18 cmH20] 18 cmH20 Plateau Pressure:  [30 cmH20-41 cmH20] 35 cmH20   Intake/Output Summary (Last 24 hours) at 05/27/2019 1530 Last data filed at 05/27/2019 1200 Gross per 24 hour  Intake 1721.39 ml  Output 1037 ml  Net 684.39 ml   Filed Weights   05/24/19 0500 05/26/19 0327 05/27/19 0453  Weight: 81 kg 81.5 kg 81 kg    Examination: General: critically ill appearing adult female lying in bed on vent in NAD HEENT: MM pink/dry, bloody secretions on lips/teeth, no jvd, ETT, anicteric, left sclera injected Neuro: sedate  CV: s1s2 rrr, radial pulses +2/symmetrical, no m/r/g PULM:  Even/non-labored on vent, lungs bilaterally coarse, no wheeze GI: soft, bsx4 active, tolerating TF Extremities: warm/dry, no edema  Skin: no rashes or lesions  Resolved Hospital Problem list     Assessment & Plan:   ARDS Refractory Hypoxemic Respiratory Failure  Severe vent dyssynchrony, improved with sedation.  Requiring intermittent paralytic.  P/F ratio 51. Completed steroids 11/29.  S/p Remdesivir. Prone positioning stopped due to lack of response & higher plateau pressures when prone.  -low Vt ventilation 4-8cc/kg -goal plateau pressure <30, driving pressure [id number] cm H2O -target PaO2 55-65, titrate PEEP/FiO2 per  ARDS protocol  -goal CVP <4, diuresis as necessary -VAP prevention measures  -follow intermittent CXR, ABG -would not be a candidate for tracheostomy at this point due to high O2 needs  Sedation needs for ventilator synchrony Previously required phenobarbital while on fentanyl -RASS Goal -3 to -4 with vent synchrony  -continue dilaudid, versed  infusions -PRN vecuronium > note 12/1 assessment of pressures did not improve on paralytics  Pneumomediastinum with Pneumopericardium and Small Bilateral Pneumothoraces. -monitor clinically   DM with Hyperglycemia  Elevated glucose in setting of steroid administration -continue levemir 15 units QD -moderate SSI  -continue linagliptin   PAF Currently in SR -tele monitoring  -heparin gtt per pharmacy   Anemia Suspect component critical illness and nutritional. No bleeding.   -trend CBC -transfuse for Hgb <7%  At Risk Malnutrition  -TF per Nutrition   Best practice:  Diet: TF Pain/Anxiety/Delirium protocol (if indicated): as above VAP protocol (if indicated): yes DVT prophylaxis: Heparin infusion GI prophylaxis: famotidine Glucose control: SSI, detemir and linagliptin Mobility: BR Code Status: DNR if cardiac arrest Family Communication: Daughter [name]) called for update 12/2.  Son-in-law Octavia Bruckner present for conversation.  Reviewed that at this point, we are dealing with single organ failure.  If she should show other decline they would be open to comfort measures.  Limitations in place in terms of DNR in the event of arrest.  Will continue supportive care for now.  Disposition: Continue ICU   CC Time: 30 minutes     Noe Gens, MSN, NP-C [facility] 05/27/2019, 3:30 PM   Please see [email or web address] for pager details.

## 2019-05-27 NOTE — Progress Notes (Signed)
ANTICOAGULATION CONSULT NOTE - Follow Up Consult  Pharmacy Consult for Heparin Indication: r/o PE, Afib   Allergies  Allergen Reactions  . Cefuroxime Rash  . Sulfamethoxazole-Trimethoprim Itching  . Aloe     itch  . Cephalosporins Rash    Patient Measurements: Height: 4\' 11"  (149.9 cm) Weight: 178 lb 9.2 oz (81 kg) IBW/kg (Calculated) : 43.2 Heparin Dosing Weight: 60kg  Vital Signs: Temp: 98.4 F (36.9 C) (12/02 0345) Temp Source: Axillary (12/02 0345) BP: 127/72 (12/02 0600) Pulse Rate: 111 (12/02 0618)  Labs: Recent Labs    05/25/19 0220 05/25/19 1240  05/26/19 0330 05/26/19 0430 05/26/19 0524 05/27/19 0330  HGB 7.5*  --    < > 9.3* 11.9* 11.2* 7.9*  HCT 27.3*  --    < > 35.6* 35.0* 33.0* 29.7*  PLT 171  --   --  254  --   --  213  HEPARINUNFRC 0.58 0.60  --  0.46  --   --  0.31  CREATININE 0.52  --   --  0.58  --   --  0.48   < > = values in this interval not displayed.    Estimated Creatinine Clearance: 63.7 mL/min (by C-G formula based on SCr of 0.48 mg/dL).   Medications:  Infusions:  . sodium chloride Stopped (05/23/19 2200)  . feeding supplement (VITAL AF 1.2 CAL) 1,000 mL (05/26/19 1813)  . heparin 650 Units/hr (05/27/19 0701)  . HYDROmorphone 2 mg/hr (05/27/19 0701)  . midazolam 5 mg/hr (05/27/19 0701)    Assessment: [name] admitted on 11/20 with COVID-19 pneumonia.  She had been on Lovenox for VTE prophylaxis.  Due to worsening hypoxemia and concern for PE, pharmacy was consulted to start IV heparin on 11/23.   Pt with ST vs SVT earlier today.    Heparin level remains therapeutic on 650 units/hr.  Hg 7.9, PTLC 213 No bleeding noted but Hg is down Goal of Therapy:  Heparin level 0.3-0.7 units/ml Monitor platelets by anticoagulation protocol: Yes   Plan:  Continue heparin IV infusion at 650 units/hr Daily heparin level and CBC Continue to monitor H&H, s/s bleeding Thanks for allowing pharmacy to be a part of this patient's care.  Teresa Barker, PharmD Clinical Pharmacist  05/27/2019 7:04 AM

## 2019-05-27 NOTE — Progress Notes (Signed)
Called and updated pt's daughter, [name]. No changes from previous except HR staying in 130s, elevated temperature and remain on low dose versed/dialudid for vent tolerance/comfort.  Questions answered to best of nurse's ability for family as well emotional support to family.

## 2019-05-27 NOTE — Progress Notes (Signed)
0535 - Pt wnt into ST vs SVT with rate in 150s .  [place] with change.Talked with Kathlee Nations, RN with information.  Awaiting call back or orders.  Had already administered PRN Metoprolol at 0430 for HR > 115 as per order.    [place] and talked with Liz,RN. Updated her on pt's conversion back to ST, rates 110s.  Sats remain 83 to 85% on FiO2100%.  Will continue to monitor pt.

## 2019-05-28 ENCOUNTER — Inpatient Hospital Stay (HOSPITAL_COMMUNITY): Payer: Medicare HMO

## 2019-05-28 DIAGNOSIS — J439 Emphysema, unspecified: Secondary | ICD-10-CM | POA: Diagnosis not present

## 2019-05-28 DIAGNOSIS — J9601 Acute respiratory failure with hypoxia: Secondary | ICD-10-CM | POA: Diagnosis not present

## 2019-05-28 DIAGNOSIS — U071 COVID-19: Secondary | ICD-10-CM | POA: Diagnosis not present

## 2019-05-28 LAB — POCT I-STAT 7, (LYTES, BLD GAS, ICA,H+H)
Acid-Base Excess: 12 mmol/L — ABNORMAL HIGH (ref 0.0–2.0)
Acid-Base Excess: 14 mmol/L — ABNORMAL HIGH (ref 0.0–2.0)
Bicarbonate: 40.1 mmol/L — ABNORMAL HIGH (ref 20.0–28.0)
Bicarbonate: 39.4 mmol/L — ABNORMAL HIGH (ref 20.0–28.0)
Calcium, Ion: 1.2 mmol/L (ref 1.15–1.40)
Calcium, Ion: 1.19 mmol/L (ref 1.15–1.40)
HCT: 38 % (ref 36.0–46.0)
HCT: 22 % — ABNORMAL LOW (ref 36.0–46.0)
Hemoglobin: 12.9 g/dL (ref 12.0–15.0)
Hemoglobin: 7.5 g/dL — ABNORMAL LOW (ref 12.0–15.0)
O2 Saturation: 87 %
O2 Saturation: 79 %
Patient temperature: 99.4
Patient temperature: 99.3
Potassium: 4.8 mmol/L (ref 3.5–5.1)
Potassium: 4.6 mmol/L (ref 3.5–5.1)
Sodium: 144 mmol/L (ref 135–145)
Sodium: 144 mmol/L (ref 135–145)
TCO2: 42 mmol/L — ABNORMAL HIGH (ref 22–32)
TCO2: 41 mmol/L — ABNORMAL HIGH (ref 22–32)
pCO2 arterial: 73.6 mmHg (ref 32.0–48.0)
pCO2 arterial: 58.2 mmHg — ABNORMAL HIGH (ref 32.0–48.0)
pH, Arterial: 7.347 — ABNORMAL LOW (ref 7.350–7.450)
pH, Arterial: 7.44 (ref 7.350–7.450)
pO2, Arterial: 61 mmHg — ABNORMAL LOW (ref 83.0–108.0)
pO2, Arterial: 44 mmHg — ABNORMAL LOW (ref 83.0–108.0)

## 2019-05-28 LAB — BASIC METABOLIC PANEL
Anion gap: 9 (ref 5–15)
BUN: 31 mg/dL — ABNORMAL HIGH (ref 8–23)
CO2: 36 mmol/L — ABNORMAL HIGH (ref 22–32)
Calcium: 8.2 mg/dL — ABNORMAL LOW (ref 8.9–10.3)
Chloride: 101 mmol/L (ref 98–111)
Creatinine, Ser: 0.51 mg/dL (ref 0.44–1.00)
GFR calc Af Amer: >60 mL/min (ref >60)
GFR calc non Af Amer: >60 mL/min (ref >60)
Glucose, Bld: 216 mg/dL — ABNORMAL HIGH (ref 70–99)
Potassium: 4.8 mmol/L (ref 3.5–5.1)
Sodium: 146 mmol/L — ABNORMAL HIGH (ref 135–145)

## 2019-05-28 LAB — HEPARIN LEVEL (UNFRACTIONATED)
Heparin Unfractionated: 0.24 IU/mL — ABNORMAL LOW (ref 0.30–0.70)
Heparin Unfractionated: 0.12 IU/mL — ABNORMAL LOW (ref 0.30–0.70)

## 2019-05-28 LAB — CBC
HCT: 25.6 % — ABNORMAL LOW (ref 36.0–46.0)
Hemoglobin: 6.9 g/dL — CL (ref 12.0–15.0)
MCH: 27 pg (ref 26.0–34.0)
MCHC: 27 g/dL — ABNORMAL LOW (ref 30.0–36.0)
MCV: 100 fL (ref 80.0–100.0)
Platelets: 237 10*3/uL (ref 150–400)
RBC: 2.56 MIL/uL — ABNORMAL LOW (ref 3.87–5.11)
RDW: 20.9 % — ABNORMAL HIGH (ref 11.5–15.5)
WBC: 16.5 10*3/uL — ABNORMAL HIGH (ref 4.0–10.5)
nRBC: 3.6 % — ABNORMAL HIGH (ref 0.0–0.2)

## 2019-05-28 LAB — HEMOGLOBIN AND HEMATOCRIT, BLOOD
HCT: 30.3 % — ABNORMAL LOW (ref 36.0–46.0)
Hemoglobin: 8.4 g/dL — ABNORMAL LOW (ref 12.0–15.0)

## 2019-05-28 LAB — GLUCOSE, CAPILLARY
Glucose-Capillary: 206 mg/dL — ABNORMAL HIGH (ref 70–99)
Glucose-Capillary: 207 mg/dL — ABNORMAL HIGH (ref 70–99)
Glucose-Capillary: 224 mg/dL — ABNORMAL HIGH (ref 70–99)
Glucose-Capillary: 190 mg/dL — ABNORMAL HIGH (ref 70–99)

## 2019-05-28 LAB — PREPARE RBC (CROSSMATCH)

## 2019-05-28 MED ORDER — INSULIN ASPART 100 UNIT/ML ~~LOC~~ SOLN
7.0000 [IU] | SUBCUTANEOUS | Status: DC
  Administered 2019-05-28 – 2019-05-30 (×12): 7 [IU] via SUBCUTANEOUS

## 2019-05-28 MED ORDER — SODIUM CHLORIDE 0.9% IV SOLUTION
Freq: Once | INTRAVENOUS | Status: AC
  Administered 2019-05-28: 14:00:00 via INTRAVENOUS

## 2019-05-28 MED ORDER — SODIUM CHLORIDE 0.9 % IV SOLN
0.5000 mg/kg/h | INTRAVENOUS | Status: DC
  Administered 2019-05-28 – 2019-05-30 (×5): 0.5 mg/kg/h via INTRAVENOUS
  Filled 2019-05-28 (×7): qty 5

## 2019-05-28 MED ORDER — HEPARIN (PORCINE) 25000 UT/250ML-% IV SOLN
900.0000 [IU]/h | INTRAVENOUS | Status: DC
  Administered 2019-05-30: 900 [IU]/h via INTRAVENOUS
  Filled 2019-05-28: qty 250

## 2019-05-28 MED ORDER — FUROSEMIDE 10 MG/ML IJ SOLN
40.0000 mg | Freq: Once | INTRAMUSCULAR | Status: AC
  Administered 2019-05-28: 16:00:00 40 mg via INTRAVENOUS
  Filled 2019-05-28: qty 4

## 2019-05-28 MED ORDER — HEPARIN (PORCINE) 25000 UT/250ML-% IV SOLN
750.0000 [IU]/h | INTRAVENOUS | Status: DC
  Administered 2019-05-28 (×2): 750 [IU]/h via INTRAVENOUS
  Filled 2019-05-28: qty 250

## 2019-05-28 NOTE — Progress Notes (Signed)
Updated Healther, charge RN, pt's saturation agrressively declining with agonal breathing. O2 Saturation is 75%. Nira Conn will update Dr Lake Bells

## 2019-05-28 NOTE — Progress Notes (Addendum)
NAME:  Teresa Barker, MRN:  [id number], DOB:  [birth year], LOS: 28 ADMISSION DATE:  05/06/2019, CONSULTATION DATE:  11/20 REFERRING MD:  Derrill Kay MD, CHIEF COMPLAINT:  COVID 19 pneumonia   Brief History   68 y/o female admitted 11/10 to [facility] for COVID pneumonia, intubated [facility] R on 11/19, moved to [facility] ICU on 11/20.   Past Medical History  COPD - not on oxygen DM2 Obesity Asthma GERD Hypertension Neuropathy Prior intubation - late 90's, on vent for 3 weeks for COPD  [place] [facility] Events   11/08 Admit to [facility] 11/19 Intubated, transfer to [facility] 11/21 New onset afib on admission > converted to NSR 11/22 Pneumomediastinum and pneumopericardium seen on CT chest 11/23 Proning protocol initiated  11/28 NMB stopped  11/30 ETT cuff blown, tube exchanged.   12/02 PEEP/FiO2 requirements remain unchanged  Consults:    Procedures:  ETT 11/19 >> 11/30 (exchange for blown cuff), 11/30 >> Femoral TLC 11/19 >> 11/21  R PICC 11/21 >>   Significant Diagnostic Tests:   CT Chest 11/22 >> moderate pneumopericardium, extensive pneumomediastinum, small bilateral pneumothoraces, bilateral peripheral GGO and consolidation involvement in >75% of lungs, small right pleural effusion  ECHO 11/23 >> EF 65 to 70%, LV hyperdynamic function.  Borderline LVH.  Indeterminate diastolic filling due to E-A fusion. Global RV systolic function normal.  RV systolic pressure 99991111 mmHg, moderately elevated. Trace MR. Mild TR. Mild calcification of AV. Moderately elevated pulmonary artery systolic pressure.  FOB 11/23 >> normal airways, no mucus plugging, no airway lesions  Micro Data:  SARS COV2 11/7 at [facility] >> Positive Tracheal aspirate 11/24 >> normal flora  MRSA PCR 11/20 >> negative  Antimicrobials/COVID19 Tx:  Completed remdesivir at [facility] Dexamethasone 11/20 >> 11/29 Pip/Tazo 11/25 >> 11/30  Interim history/subjective:  T-max 99.3.  RT reports PEEP increased to 20,  remains on 100% FiO2.  Episodes of desaturation with any stimulation.  RN reports hemoglobin 6.9.  No evidence of acute bleeding.  Objective   Blood pressure 122/66, pulse (!) 108, temperature 99.3 F (37.4 C), temperature source Oral, resp. rate (!) 37, height 4\' 11"  (1.499 m), weight 81 kg, SpO2 90 %. CVP:  [5 mmHg-19 mmHg] 14 mmHg  Vent Mode: PRVC FiO2 (%):  [100 %] 100 % Set Rate:  [35 bmp] 35 bmp Vt Set:  [300 mL] 300 mL PEEP:  [18 cmH20] 18 cmH20 Plateau Pressure:  [30 cmH20-44 cmH20] 41 cmH20   Intake/Output Summary (Last 24 hours) at 05/28/2019 0859 Last data filed at 05/28/2019 0600 Gross per 24 hour  Intake 1747.51 ml  Output 900 ml  Net 847.51 ml   Filed Weights   05/26/19 0327 05/27/19 0453 05/28/19 0500  Weight: 81.5 kg 81 kg 81 kg    Examination: General: Critically ill-appearing small adult female lying in bed in no acute distress on vent HEENT: MM pink/moist, dried bloody secretions on lips/teeth, no JVD, ETT, anicteric, mild left scleral injection Neuro: Sedate CV: s1s2 RRR, tachycardia 120's, no m/r/g, radial blood pulses +2 and symmetrical bilaterally PULM: Even/nonlabored on vent, coarse breath sounds bilaterally without wheeze GI: soft, bsx4 active, tolerating TF Extremities: warm/dry, no edema  Skin: no rashes or lesions.  Left upper extremity hematoma unchanged (prior IV infiltration of heparin)  Resolved Hospital Problem list     Assessment & Plan:   ARDS Refractory Hypoxemic Respiratory Failure  Severe vent dyssynchrony, improved with sedation.  Requiring intermittent paralytic. Completed steroids 11/29.  S/p Remdesivir. Prone positioning stopped due  to lack of response & higher plateau pressures when prone.  -Would not be a candidate for tracheostomy at this point due to high O2 needs -low Vt ventilation 4-8cc/kg -goal plateau pressure <30, driving pressure [id number] cm H2O -target PaO2 55-65, titrate PEEP/FiO2 per ARDS protocol  -if P/F ratio <150,  consider prone therapy for 16 hours per day -goal CVP <4, diuresis as necessary -lasix 40 mg IV x1 -VAP prevention measures  -follow intermittent CXR, ABG  Sedation needs for ventilator synchrony Previously required phenobarbital while on fentanyl -RASS Goal -3 to -4 with ventilator synchrony  -10 you Dilaudid, Versed infusion -Add ketamine infusion, monitor closely for ketamine associated tachycardia -If patient does not have improvement on ketamine consider transition to dexmedetomidine -PRN vecuronium, note 12/1 assessment of pressures did not improve on paralytics -Continue PT Klonopin, gabapentin, oxycodone  Pneumomediastinum with Pneumopericardium and Small Bilateral Pneumothoraces. -monitor clinically   DM with Hyperglycemia  Elevated glucose in setting of steroid administration -Continue Levemir 15 units QD -Moderate sliding scale insulin -increase TF coverage to 7 units Q4 -continue linagliptin  PAF Remains in SR 12/3 -Telemetry monitoring -Heparin per pharmacy  Anemia Suspect component critical illness and nutritional. No bleeding.   -Transfuse 1 unit PRBC -Follow-up H/H post transfusion -lasix 40 mg IV x1   At Risk Malnutrition  -TF per Nutrition  Best practice:  Diet: TF Pain/Anxiety/Delirium protocol (if indicated): as above VAP protocol (if indicated): yes DVT prophylaxis: Heparin infusion GI prophylaxis: famotidine Glucose control: SSI, detemir and linagliptin Mobility: BR Code Status: DNR if cardiac arrest Family Communication: Daughter [name]) called for update 12/3.  Son-in-law Octavia Bruckner present for conversation.  Reviewed that at this point, we are dealing with single organ failure.  If she should show other decline they would be open to comfort measures.  Limitations in place in terms of DNR in the event of arrest.  Will continue supportive care for now.  Disposition: Continue ICU   CC Time: 23 minutes     Noe Gens, MSN, NP-C [place]  Pulmonary & Critical Care 05/28/2019, 8:59 AM   Please see [email or web address] for pager details.

## 2019-05-28 NOTE — Progress Notes (Signed)
ANTICOAGULATION CONSULT NOTE - Follow Up Consult  Pharmacy Consult for Heparin Indication: r/o PE, Afib   Allergies  Allergen Reactions  . Cefuroxime Rash  . Sulfamethoxazole-Trimethoprim Itching  . Aloe     itch  . Cephalosporins Rash    Patient Measurements: Height: 4\' 11"  (149.9 cm) Weight: 178 lb 9.2 oz (81 kg) IBW/kg (Calculated) : 43.2 Heparin Dosing Weight: 60kg  Vital Signs: Temp: 98.2 F (36.8 C) (12/03 1600) Temp Source: Oral (12/03 1600) BP: 152/79 (12/03 1800) Pulse Rate: 120 (12/03 1800)  Labs: Recent Labs    05/26/19 0330  05/27/19 0330  05/28/19 0455 05/28/19 0909 05/28/19 1700  HGB 9.3*   < > 7.9*   < > 6.9* 7.5* 8.4*  HCT 35.6*   < > 29.7*   < > 25.6* 22.0* 30.3*  PLT 254  --  213  --  237  --   --   HEPARINUNFRC 0.46  --  0.31  --  0.24*  --  0.12*  CREATININE 0.58  --  0.48  --  0.51  --   --    < > = values in this interval not displayed.    Estimated Creatinine Clearance: 63.7 mL/min (by C-G formula based on SCr of 0.51 mg/dL).   Medications:  Infusions:  . sodium chloride 10 mL/hr at 05/28/19 1500  . feeding supplement (VITAL AF 1.2 CAL) 1,000 mL (05/28/19 1245)  . heparin 750 Units/hr (05/28/19 1717)  . HYDROmorphone 2 mg/hr (05/28/19 1500)  . ketamine (KETALAR) Adult IV Infusion 0.5 mg/kg/hr (05/28/19 1500)  . midazolam 2 mg/hr (05/28/19 1716)    Assessment: 86 yoF admitted on 11/20 with COVID-19 pneumonia.  She had been on Lovenox for VTE prophylaxis.  Due to worsening hypoxemia and concern for PE, pharmacy was consulted to start IV heparin on 11/23.  She also has PAF.  Heparin level 0.12, decreased to sub therapeutic despite rate increase to 750 units/hr, however, may be concentrated s/p transfusion.   Post transfusion H/H 8.4/30.3    Goal of Therapy:  Heparin level 0.3-0.7 units/ml Monitor platelets by anticoagulation protocol: Yes   Plan:  Increase to heparin IV infusion at 850 units/hr  Heparin level in 6 hours  Daily  heparin level and CBC Continue to monitor H&H, s/s bleeding   Thanks for allowing pharmacy to be a part of this patient's care.  Peggyann Juba, PharmD, BCPS Pharmacy: [phone] 05/28/2019 6:38 PM

## 2019-05-28 NOTE — Progress Notes (Signed)
Spoke with patient's daughter [name] update given. Consent received for blood products. All questions answered at this time.

## 2019-05-28 NOTE — Progress Notes (Signed)
Spoke with patient's daughter Teresa Barker. Update given and questions answered.  Informed of patient's tenuous status with oxygen remaining low despite efforts to support, daughter states she is aware and that doctor had called her and gave her a good update. Very emotional on the phone, but states that she is going to try to make a decision about plan of care by the morning. RN attempted to provide comfort and offered to facetime with her so that she could see her mom, but she declined. Will continue with current interventions at this time and assured family will call with changes.   Daughter requested facetime for patient's sister Teresa Barker at [phone]. RN facetimed with patient's sister.

## 2019-05-28 NOTE — Progress Notes (Signed)
ANTICOAGULATION CONSULT NOTE - Follow Up Consult  Pharmacy Consult for Heparin Indication: r/o PE, Afib   Allergies  Allergen Reactions  . Cefuroxime Rash  . Sulfamethoxazole-Trimethoprim Itching  . Aloe     itch  . Cephalosporins Rash    Patient Measurements: Height: 4\' 11"  (149.9 cm) Weight: 178 lb 9.2 oz (81 kg) IBW/kg (Calculated) : 43.2 Heparin Dosing Weight: 60kg  Vital Signs: Temp: 100 F (37.8 C) (12/03 0445) Temp Source: Oral (12/03 0445) BP: 114/70 (12/03 0700) Pulse Rate: 109 (12/03 0700)  Labs: Recent Labs    05/26/19 0330 05/26/19 0430 05/26/19 0524 05/27/19 0330 05/28/19 0455  HGB 9.3* 11.9* 11.2* 7.9*  --   HCT 35.6* 35.0* 33.0* 29.7*  --   PLT 254  --   --  213  --   HEPARINUNFRC 0.46  --   --  0.31 0.24*  CREATININE 0.58  --   --  0.48 0.51    Estimated Creatinine Clearance: 63.7 mL/min (by C-G formula based on SCr of 0.51 mg/dL).   Medications:  Infusions:  . sodium chloride 10 mL/hr at 05/28/19 0600  . feeding supplement (VITAL AF 1.2 CAL) 1,000 mL (05/27/19 1521)  . heparin 650 Units/hr (05/28/19 0600)  . HYDROmorphone 2 mg/hr (05/28/19 0600)  . midazolam 2 mg/hr (05/28/19 0600)    Assessment: 35 yoF admitted on 11/20 with COVID-19 pneumonia.  She had been on Lovenox for VTE prophylaxis.  Due to worsening hypoxemia and concern for PE, pharmacy was consulted to start IV heparin on 11/23.  She also has PAF.  Heparin level 0.24, decreased to sub therapeutic on 650 units/hr.   CBC pending.   No bleeding or complications reported.   Goal of Therapy:  Heparin level 0.3-0.7 units/ml Monitor platelets by anticoagulation protocol: Yes   Plan:  Increase to heparin IV infusion at 750 units/hr  Heparin level in 6 hours  Daily heparin level and CBC Continue to monitor H&H, s/s bleeding   Thanks for allowing pharmacy to be a part of this patient's care.  Gretta Arab PharmD, BCPS Clinical pharmacist phone 7am- 5pm:  [phone] 05/28/2019 7:13 AM

## 2019-05-28 NOTE — Progress Notes (Signed)
Inpatient Diabetes Program Recommendations  AACE/ADA: New Consensus Statement on Inpatient Glycemic Control (2015)  Target Ranges:  Prepandial:   less than 140 mg/dL      Peak postprandial:   less than 180 mg/dL (1-2 hours)      Critically ill patients:  140 - 180 mg/dL   Lab Results  Component Value Date   GLUCAP 207 (H) 05/28/2019   HGBA1C 9.8 (H) 05/10/2019    Review of Glycemic Control Results for SAN, BRAATEN (MRN [id number]) as of 05/28/2019 09:44  Ref. Range 05/27/2019 16:09 05/27/2019 19:59 05/27/2019 23:06 05/28/2019 04:54 05/28/2019 08:00  Glucose-Capillary Latest Ref Range: 70 - 99 mg/dL 188 (H) 190 (H) 257 (H) 206 (H) 207 (H)   Diabetes history: DM 2 Outpatient Diabetes medications: Farxiga 10 mg Daily, Glipizide 10 mg Daily, Metformin 1000 mg bid, Tresiba 25 units Daily Current orders for Inpatient glycemic control:  Levemir 15 units Daily Novolog 0-15 units Q4 hours Novolog 6 units Q4 Tube Feed Coverage Tradjenta 5 mg Daily  Steroids: None Vital AF 55 ml/hour BUN/Creat: 31/0.51  Inpatient Diabetes Program Recommendations:    Glucose trends elevated. Tube Feed rate increased to 55 ml/hour yesterday.   Please consider increasing Novolog tube feed coverage to 8 units Q4 hours.  (do not give if tube feeds are stopped or held or any reason).  Thanks,  Tama Headings RN, MSN, BC-ADM Inpatient Diabetes Coordinator Team Pager [phone] (8a-5p)

## 2019-05-28 NOTE — Plan of Care (Signed)
  Interdisciplinary Goals of Care Family Meeting   Date carried out:: 05/28/2019  Location of the meeting: Phone conference  Member's involved: Physician  Durable Power of Attorney or acting medical decision maker: Daughter Teresa Barker.      Discussion: We discussed goals of care for [name] .  I explained things are worsening significantly including oliguria on top of already severe and worsening respiratory failure.  They voiced undestanding.  We agreed to not escalate care further and Teresa Barker and her sister are going to decide if they want to withdraw care in the next 24 hours.  They do not want to visit.  Code status: Full DNR  Disposition: Continue current acute care  Time spent for the meeting: 30 minutes  [name] 05/28/2019, 4:13 PM

## 2019-05-29 ENCOUNTER — Inpatient Hospital Stay (HOSPITAL_COMMUNITY): Payer: Medicare HMO

## 2019-05-29 ENCOUNTER — Other Ambulatory Visit: Payer: Self-pay

## 2019-05-29 DIAGNOSIS — R739 Hyperglycemia, unspecified: Secondary | ICD-10-CM

## 2019-05-29 DIAGNOSIS — E0965 Drug or chemical induced diabetes mellitus with hyperglycemia: Secondary | ICD-10-CM

## 2019-05-29 LAB — BASIC METABOLIC PANEL
Anion gap: 7 (ref 5–15)
BUN: 29 mg/dL — ABNORMAL HIGH (ref 8–23)
CO2: 37 mmol/L — ABNORMAL HIGH (ref 22–32)
Calcium: 8.2 mg/dL — ABNORMAL LOW (ref 8.9–10.3)
Chloride: 101 mmol/L (ref 98–111)
Creatinine, Ser: 0.49 mg/dL (ref 0.44–1.00)
GFR calc Af Amer: >60 mL/min (ref >60)
GFR calc non Af Amer: >60 mL/min (ref >60)
Glucose, Bld: 241 mg/dL — ABNORMAL HIGH (ref 70–99)
Potassium: 4.7 mmol/L (ref 3.5–5.1)
Sodium: 145 mmol/L (ref 135–145)

## 2019-05-29 LAB — CBC
HCT: 30.7 % — ABNORMAL LOW (ref 36.0–46.0)
Hemoglobin: 8.3 g/dL — ABNORMAL LOW (ref 12.0–15.0)
MCH: 26.7 pg (ref 26.0–34.0)
MCHC: 27 g/dL — ABNORMAL LOW (ref 30.0–36.0)
MCV: 98.7 fL (ref 80.0–100.0)
Platelets: 268 10*3/uL (ref 150–400)
RBC: 3.11 MIL/uL — ABNORMAL LOW (ref 3.87–5.11)
RDW: 19.8 % — ABNORMAL HIGH (ref 11.5–15.5)
WBC: 14.6 10*3/uL — ABNORMAL HIGH (ref 4.0–10.5)
nRBC: 6.7 % — ABNORMAL HIGH (ref 0.0–0.2)

## 2019-05-29 LAB — GLUCOSE, CAPILLARY
Glucose-Capillary: 170 mg/dL — ABNORMAL HIGH (ref 70–99)
Glucose-Capillary: 188 mg/dL — ABNORMAL HIGH (ref 70–99)
Glucose-Capillary: 191 mg/dL — ABNORMAL HIGH (ref 70–99)
Glucose-Capillary: 206 mg/dL — ABNORMAL HIGH (ref 70–99)
Glucose-Capillary: 220 mg/dL — ABNORMAL HIGH (ref 70–99)
Glucose-Capillary: 227 mg/dL — ABNORMAL HIGH (ref 70–99)
Glucose-Capillary: 238 mg/dL — ABNORMAL HIGH (ref 70–99)
Glucose-Capillary: 238 mg/dL — ABNORMAL HIGH (ref 70–99)

## 2019-05-29 LAB — HEPARIN LEVEL (UNFRACTIONATED)
Heparin Unfractionated: 0.27 IU/mL — ABNORMAL LOW (ref 0.30–0.70)
Heparin Unfractionated: 0.3 IU/mL (ref 0.30–0.70)
Heparin Unfractionated: 0.35 IU/mL (ref 0.30–0.70)

## 2019-05-29 LAB — HEMOGLOBIN AND HEMATOCRIT, BLOOD
HCT: 29.3 % — ABNORMAL LOW (ref 36.0–46.0)
Hemoglobin: 7.6 g/dL — ABNORMAL LOW (ref 12.0–15.0)

## 2019-05-29 LAB — PREPARE RBC (CROSSMATCH)

## 2019-05-29 MED ORDER — FUROSEMIDE 10 MG/ML IJ SOLN
40.0000 mg | Freq: Once | INTRAMUSCULAR | Status: AC
  Administered 2019-05-29: 14:00:00 40 mg via INTRAVENOUS
  Filled 2019-05-29: qty 4

## 2019-05-29 MED ORDER — SODIUM CHLORIDE 0.9 % IV SOLN
250.0000 mL | INTRAVENOUS | Status: DC
  Administered 2019-05-30: 250 mL via INTRAVENOUS

## 2019-05-29 MED ORDER — SODIUM CHLORIDE 0.9% IV SOLUTION
Freq: Once | INTRAVENOUS | Status: AC
  Administered 2019-05-29: 23:00:00 via INTRAVENOUS

## 2019-05-29 MED ORDER — CLONAZEPAM 1 MG PO TABS
2.0000 mg | ORAL_TABLET | Freq: Two times a day (BID) | ORAL | Status: DC
  Administered 2019-05-29 – 2019-05-30 (×2): 2 mg
  Filled 2019-05-29 (×2): qty 2

## 2019-05-29 MED ORDER — SODIUM CHLORIDE 0.9 % IV SOLN: 250.0000 mL | INTRAVENOUS | Status: DC

## 2019-05-29 MED ORDER — PHENYLEPHRINE HCL-NACL 10-0.9 MG/250ML-% IV SOLN
25.0000 ug/min | INTRAVENOUS | Status: DC
  Administered 2019-05-29: 25 ug/min via INTRAVENOUS
  Administered 2019-05-30: 170 ug/min via INTRAVENOUS
  Filled 2019-05-29 (×3): qty 250

## 2019-05-29 MED ORDER — OXYCODONE HCL 5 MG PO TABS
10.0000 mg | ORAL_TABLET | Freq: Four times a day (QID) | ORAL | Status: DC
  Administered 2019-05-29 – 2019-05-30 (×4): 10 mg
  Filled 2019-05-29 (×4): qty 2

## 2019-05-29 MED ORDER — INSULIN DETEMIR 100 UNIT/ML ~~LOC~~ SOLN
20.0000 [IU] | Freq: Every day | SUBCUTANEOUS | Status: DC
  Administered 2019-05-30: 08:00:00 20 [IU] via SUBCUTANEOUS
  Filled 2019-05-29: qty 0.2

## 2019-05-29 NOTE — Progress Notes (Signed)
NAME:  Teresa Barker, MRN:  [id number], DOB:  Sep 08, 1950, LOS: 33 ADMISSION DATE:  05/21/2019, CONSULTATION DATE:  11/20 REFERRING MD:  Derrill Kay MD, CHIEF COMPLAINT:  COVID 19 pneumonia   Brief History   68 y/o female admitted 11/10 to [facility] for COVID pneumonia, intubated [facility] on 11/19, moved to [facility] ICU on 11/20.   Past Medical History  COPD - not on oxygen DM2 Obesity Asthma GERD Hypertension Neuropathy Prior intubation - late 90's, on vent for 3 weeks for COPD  [place] [facility] Events   11/08 Admit to [facility] 11/19 Intubated, transfer to [facility] 11/21 New onset afib on admission > converted to NSR 11/22 Pneumomediastinum and pneumopericardium seen on CT chest 11/23 Proning protocol initiated  11/28 NMB stopped  11/30 ETT cuff blown, tube exchanged.   12/02 PEEP/FiO2 requirements remain unchanged 12/03 Ketamine added to sedation, transfused 1 unit PRBC for anemia  Consults:    Procedures:  ETT 11/19 >> 11/30 (exchange for blown cuff), 11/30 >> Femoral TLC 11/19 >> 11/21  R PICC 11/21 >>   Significant Diagnostic Tests:   CT Chest 11/22 >> moderate pneumopericardium, extensive pneumomediastinum, small bilateral pneumothoraces, bilateral peripheral GGO and consolidation involvement in >75% of lungs, small right pleural effusion  ECHO 11/23 >> EF 65 to 70%, LV hyperdynamic function.  Borderline LVH.  Indeterminate diastolic filling due to E-A fusion. Global RV systolic function normal.  RV systolic pressure 99991111 mmHg, moderately elevated. Trace MR. Mild TR. Mild calcification of AV. Moderately elevated pulmonary artery systolic pressure.  FOB 11/23 >> normal airways, no mucus plugging, no airway lesions  Micro Data:  SARS COV2 11/7 at [facility] >> Positive Tracheal aspirate 11/24 >> normal flora  MRSA PCR 11/20 >> negative  Antimicrobials/COVID19 Tx:  Completed remdesivir at [facility] Dexamethasone 11/20 >> 11/29 Pip/Tazo 11/25 >> 11/30  Interim  history/subjective:  Tmax 99.6. I/O- 1.5L UOP in last 24h, 922+ in last 24.  Received PRBC 1 unit 12/3 for anemia.  RT reports pt more comfortable with addition of ketamine, vent synchrony improved.   Objective   Blood pressure 114/64, pulse (!) 126, temperature 99.6 F (37.6 C), temperature source Axillary, resp. rate (!) 35, height 4\' 11"  (1.499 m), weight 81 kg, SpO2 (!) 83 %. CVP:  [16 mmHg-41 mmHg] 26 mmHg  Vent Mode: PCV FiO2 (%):  [100 %] 100 % Set Rate:  [35 bmp] 35 bmp PEEP:  [16 cmH20-20 cmH20] 16 cmH20 Plateau Pressure:  [32 cmH20-47 cmH20] 34 cmH20   Intake/Output Summary (Last 24 hours) at 05/29/2019 1207 Last data filed at 05/29/2019 0900 Gross per 24 hour  Intake 2664.65 ml  Output 1550 ml  Net 1114.65 ml   Filed Weights   05/26/19 0327 05/27/19 0453 05/28/19 0500  Weight: 81.5 kg 81 kg 81 kg    Examination: General: Critically ill-appearing small adult female lying in bed on vent in no acute distress HEENT: MM pink/moist, ETT, dried bloody secretions on lips/teeth, no JVD, anicteric, mild left scleral injection Neuro: Sedate CV: s1s2 RRR, tachy 130's, no m/r/g PULM: Nonlabored on mechanical ventilation, coarse breath sounds bilaterally without wheeze GI: soft, bsx4 active  Extremities: warm/dry, 1-2+ pretibial pitting edema  Skin: no rashes or lesions, LUE hematoma unchanged (prior infiltration of heparin drip)  Resolved Hospital Problem list     Assessment & Plan:   ARDS Refractory Hypoxemic Respiratory Failure  Severe vent dyssynchrony, improved with sedation.  Requiring intermittent paralytic. Completed steroids 11/29.  S/p Remdesivir. Prone positioning stopped  due to lack of response & higher plateau pressures when prone.  -Patient would not be a candidate at this point for tracheostomy due to high O2 needs -low Vt ventilation 4-8cc/kg -goal plateau pressure <30, driving pressure [id number] cm H2O -target PaO2 55-65, titrate PEEP/FiO2 per ARDS protocol  -if  P/F ratio <150, consider prone therapy for 16 hours per day -goal CVP <4, diuresis as necessary -VAP prevention measures  -follow intermittent CXR, ABG -lasix 40 mg IV x1  Sedation needs for ventilator synchrony Previously required phenobarbital while on fentanyl -RASS Goal -3 to -4 with vent synchrony  -continue dilaudid, ketamine, versed infusions -PRN vecuronium, note 12/1 assessment of compliance unchanged with paralytic administration  -Gabapentin, klonopin, oxycodone  Pneumomediastinum with Pneumopericardium and Small Bilateral Pneumothoraces. -monitor clinically   DM with Hyperglycemia  Elevated glucose in setting of steroid administration.  Glucose range 150-250 -increase levemir to 20 units  -moderate SSI -TF coverage 7 units Q4 -continue linagliptin   PAF Remains in SR 12/4 -continue tele monitoring -heparin gtt per pharmacy   Anemia Suspect component critical illness and nutritional. No bleeding.  PRBC 12/3 x1 -follow CBC -transfuse for Hgb <7%  At Risk Malnutrition  -TF per nutrition  Best practice:  Diet: TF Pain/Anxiety/Delirium protocol (if indicated): as above VAP protocol (if indicated): yes DVT prophylaxis: Heparin infusion GI prophylaxis: famotidine Glucose control: SSI, detemir and linagliptin Mobility: BR Code Status: DNR if cardiac arrest Family Communication: Daughter [name]) called for update 12/4 no answer, message left.  Son-in-law Octavia Bruckner present for conversation.  Reviewed that at this point, we are dealing with single organ failure.  If she should show other decline they would be open to comfort measures.  Limitations in place in terms of DNR in the event of arrest.  Will continue supportive care for now.  Disposition: ICU   CC Time: 30 minutes     Noe Gens, MSN, NP-C  Pulmonary & Critical Care 05/29/2019, 12:07 PM   Please see [email or web address] for pager details.

## 2019-05-29 NOTE — Progress Notes (Signed)
Call made to pt's family. Updated on pt condition and plan of care. Questions answered.

## 2019-05-29 NOTE — Progress Notes (Signed)
Called [facility] back and spoke with Dr. Genevive Bi and made her aware that there are not actual orders for vasopressors.  Patient needs something to help her blood pressure come up. Blood infusing.

## 2019-05-29 NOTE — Progress Notes (Signed)
Called [facility] and spoke with Genevive Bi, MD and made her aware that patient appears to be in SVT rate 160's with stable BP.  MD asked RN to obtain an EKG.

## 2019-05-29 NOTE — Progress Notes (Signed)
[facility]Link Physician-Brief Progress Note Patient Name: Teresa Barker DOB: 1950/08/23 MRN: [id number]   Date of Service  05/29/2019  HPI/Events of Note  Notified of hypoxia and hypotension. Also with report of bloody bowel movement last H/H 8.3/30.7  eICU Interventions   Ordered to transfuse 1 unit PRBC, also ordered stat H/H  Obtain stat CXR to rule out pneumothorax, peak pressure 40s on vent on FiO2 100%16 PEEP     Intervention Category Major Interventions: Hypotension - evaluation and management;Hypoxemia - evaluation and management  Judd Lien 05/29/2019, 9:32 PM

## 2019-05-29 NOTE — Progress Notes (Deleted)
Call made to pt's husband, Jeneen Rinks to give update on pt condition and plan of care. Questions answered.

## 2019-05-29 NOTE — Progress Notes (Signed)
Called Teresa Barker and spoke with Kathlee Nations, RN and asked her to make MD aware that EKG has been uploaded in [facility].  Kathlee Nations, RN to relay message as MD is not available. Patient now Sinus tach rate 120's.

## 2019-05-29 NOTE — Progress Notes (Signed)
eLink Physician-Brief Progress Note Patient Name: Teresa Barker DOB: [birth year] MRN: [id number]   Date of Service  05/29/2019  HPI/Events of Note  Notified of HR 130s. EKG ordered showed sinus tachycardia. BP 135/74. Ongoing discussion about goals of care with possible withdrawal in am. I ma unable to camera in to this room.  eICU Interventions   High O2 requiring 100%/8 PEEP O2 sat 80s likely driving the tachycardia  May give metoprolol 2.5 mg IV that's ordered.     Intervention Category Major Interventions: Arrhythmia - evaluation and management  Raquel Sarna T Caspar Favila 05/29/2019, 1:32 AM

## 2019-05-29 NOTE — Progress Notes (Signed)
Patient hypotensive and blood noticed in Flexiseal tubing. [facility] notified and pressors being ordered.

## 2019-05-29 NOTE — Progress Notes (Signed)
ANTICOAGULATION CONSULT NOTE - Follow Up Consult  Pharmacy Consult for Heparin Indication: r/o PE, Afib   Allergies  Allergen Reactions  . Cefuroxime Rash  . Sulfamethoxazole-Trimethoprim Itching  . Aloe     itch  . Cephalosporins Rash    Patient Measurements: Height: 4\' 11"  (149.9 cm) Weight: 178 lb 9.2 oz (81 kg) IBW/kg (Calculated) : 43.2 Heparin Dosing Weight: 60kg  Vital Signs: Temp: 99.4 F (37.4 C) (12/04 0000) Temp Source: Axillary (12/04 0000) BP: 135/76 (12/04 0400) Pulse Rate: 130 (12/04 0400)  Labs: Recent Labs    05/27/19 0330  05/28/19 0455 05/28/19 0909 05/28/19 1700 05/29/19 0200 05/29/19 0300  HGB 7.9*   < > 6.9* 7.5* 8.4* 8.3*  --   HCT 29.7*   < > 25.6* 22.0* 30.3* 30.7*  --   PLT 213  --  237  --   --  268  --   HEPARINUNFRC 0.31  --  0.24*  --  0.12*  --  0.27*  CREATININE 0.48  --  0.51  --   --   --   --    < > = values in this interval not displayed.    Estimated Creatinine Clearance: 63.7 mL/min (by C-G formula based on SCr of 0.51 mg/dL).   Assessment: 75 yoF admitted on 11/20 with COVID-19 pneumonia.  She had been on Lovenox for VTE prophylaxis.  Due to worsening hypoxemia and concern for PE, pharmacy was consulted to start IV heparin on 11/23.  She also has PAF.  05/29/2019 0400 UPDATE: Heparin level: 0.27 IU/mL, just slightly below therapeutic goal range RN reports no issues with IV site and no signs of bleeding CBC pending    Goal of Therapy:  Heparin level 0.3-0.7 units/ml Monitor platelets by anticoagulation protocol: Yes   Plan:  Increase  heparin infusion rate  to  900 units/hr  Re-check heparin level in ~6-8hrs  Daily heparin level and CBC Continue to monitor H&H, s/s bleeding   Thanks for allowing pharmacy to participate in this patient's care.  Despina Pole, Pharm. D. Clinical Pharmacist 05/29/2019 4:20 AM

## 2019-05-29 NOTE — Progress Notes (Signed)
ANTICOAGULATION CONSULT NOTE - Follow Up Consult  Pharmacy Consult for Heparin Indication: r/o PE, Afib   Allergies  Allergen Reactions  . Cefuroxime Rash  . Sulfamethoxazole-Trimethoprim Itching  . Aloe     itch  . Cephalosporins Rash    Patient Measurements: Height: 4\' 11"  (149.9 cm) Weight: 178 lb 9.2 oz (81 kg) IBW/kg (Calculated) : 43.2 Heparin Dosing Weight: 60kg  Vital Signs: Temp: 99.6 F (37.6 C) (12/04 0400) Temp Source: Oral (12/04 0400) BP: 113/53 (12/04 0801) Pulse Rate: 128 (12/04 0801)  Labs: Recent Labs    05/27/19 0330  05/28/19 0455 05/28/19 0909 05/28/19 1700 05/29/19 0200 05/29/19 0300  HGB 7.9*   < > 6.9* 7.5* 8.4* 8.3*  --   HCT 29.7*   < > 25.6* 22.0* 30.3* 30.7*  --   PLT 213  --  237  --   --  268  --   HEPARINUNFRC 0.31  --  0.24*  --  0.12*  --  0.27*  CREATININE 0.48  --  0.51  --   --  0.49  --    < > = values in this interval not displayed.    Estimated Creatinine Clearance: 63.7 mL/min (by C-G formula based on SCr of 0.49 mg/dL).   Medications:  Infusions:  . sodium chloride 10 mL/hr at 05/29/19 0700  . feeding supplement (VITAL AF 1.2 CAL) 1,000 mL (05/29/19 0557)  . heparin 900 Units/hr (05/29/19 0700)  . HYDROmorphone 2.25 mg/hr (05/29/19 0700)  . ketamine (KETALAR) Adult IV Infusion 0.5 mg/kg/hr (05/29/19 0700)  . midazolam 3 mg/hr (05/29/19 0700)    Assessment: Teresa Barker admitted on 11/20 with COVID-19 pneumonia.  She had been on Lovenox for VTE prophylaxis.  Due to worsening hypoxemia and concern for PE, pharmacy was consulted to start IV heparin on 11/23.  She also has PAF.  Heparin level 0.3, increased to therapeutic on 900 units/hr.   CBC: Hgb improved 6.9 > 8.3 this morning s/p transfusion on 12/3, Plt WNL.   No major bleeding or complications reported.  RN reports very minor bleeding from oral sores.  Goal of Therapy:  Heparin level 0.3-0.7 units/ml Monitor platelets by anticoagulation protocol: Yes   Plan:   Continue heparin IV infusion 900 units/hr  Heparin level in 6 hours to confirm therapeutic rate. Daily heparin level and CBC Continue to monitor H&H, s/s bleeding   Thanks for allowing pharmacy to be a part of this patient's care.  Gretta Arab PharmD, BCPS Clinical pharmacist phone 7am- 5pm: [phone] 05/29/2019 8:24 AM

## 2019-05-29 NOTE — Progress Notes (Addendum)
Called [facility] and spoke with Kathlee Nations, RN and made her aware that patient's blood pressure has been systolic in the 0000000 for the past hour, waiting for blood to come from [place]. Liz,RN to make Dr. Genevive Bi aware.

## 2019-05-29 NOTE — Progress Notes (Signed)
ANTICOAGULATION CONSULT NOTE - Follow Up Consult  Pharmacy Consult for Heparin Indication: r/o PE, Afib   Allergies  Allergen Reactions  . Cefuroxime Rash  . Sulfamethoxazole-Trimethoprim Itching  . Aloe     itch  . Cephalosporins Rash    Patient Measurements: Height: 4\' 11"  (149.9 cm) Weight: 178 lb 9.2 oz (81 kg) IBW/kg (Calculated) : 43.2 Heparin Dosing Weight: 60kg  Vital Signs: Temp: 99.5 F (37.5 C) (12/04 1600) Temp Source: Axillary (12/04 1600) BP: 100/53 (12/04 1700) Pulse Rate: 117 (12/04 1700)  Labs: Recent Labs    05/27/19 0330  05/28/19 0455 05/28/19 0909 05/28/19 1700 05/29/19 0200 05/29/19 0300 05/29/19 1058 05/29/19 1700  HGB 7.9*   < > 6.9* 7.5* 8.4* 8.3*  --   --   --   HCT 29.7*   < > 25.6* 22.0* 30.3* 30.7*  --   --   --   PLT 213  --  237  --   --  268  --   --   --   HEPARINUNFRC 0.31  --  0.24*  --  0.12*  --  0.27* 0.30 0.35  CREATININE 0.48  --  0.51  --   --  0.49  --   --   --    < > = values in this interval not displayed.    Estimated Creatinine Clearance: 63.7 mL/min (by C-G formula based on SCr of 0.49 mg/dL).   Medications:  Infusions:  . sodium chloride 10 mL/hr at 05/29/19 1700  . feeding supplement (VITAL AF 1.2 CAL) 55 mL/hr at 05/29/19 1500  . heparin 900 Units/hr (05/29/19 1700)  . HYDROmorphone 2.25 mg/hr (05/29/19 1700)  . ketamine (KETALAR) Adult IV Infusion 0.5 mg/kg/hr (05/29/19 1700)  . midazolam 3 mg/hr (05/29/19 1700)    Assessment: 42 yoF admitted on 11/20 with COVID-19 pneumonia.  She had been on Lovenox for VTE prophylaxis.  Due to worsening hypoxemia and concern for PE, pharmacy was consulted to start IV heparin on 11/23.  She also has PAF.  Heparin level 0.3, increased to therapeutic on 900 units/hr.   CBC: Hgb improved 6.9 > 8.3 this morning s/p transfusion on 12/3, Plt WNL.   No major bleeding or complications reported.  RN reports very minor bleeding from oral sores.  PM: repeat heparin level is  therapeutic [id number]; no complications reported.  Goal of Therapy:  Heparin level 0.3-0.7 units/ml Monitor platelets by anticoagulation protocol: Yes   Plan:  Continue heparin IV infusion 900 units/hr  Daily heparin level and CBC Continue to monitor H&H, s/s bleeding   Thanks for allowing pharmacy to be a part of this patient's care.  Peggyann Juba, PharmD, BCPS Pharmacy: [phone] 05/29/2019 6:45 PM

## 2019-05-29 NOTE — Progress Notes (Signed)
Attending:    Subjective: More comfortable on ketamine Diuresis again today  Hgb improved Pressure stable Decreased PEEP  Objective: Vitals:   05/29/19 0700 05/29/19 0800 05/29/19 0801 05/29/19 0830  BP: 120/62  (!) 113/53 114/64  Pulse: (!) 129 (!) 126 (!) 128 (!) 126  Resp: (!) 35 (!) 35 (!) 35 (!) 35  Temp:    99.6 F (37.6 C)  TempSrc:    Axillary  SpO2: (!) 87% (!) 85% (!) 85% (!) 83%  Weight:      Height:       Vent Mode: PCV FiO2 (%):  [100 %] 100 % Set Rate:  [35 bmp] 35 bmp PEEP:  [16 cmH20-20 cmH20] 16 cmH20 Plateau Pressure:  [32 cmH20-47 cmH20] 34 cmH20  Intake/Output Summary (Last 24 hours) at 05/29/2019 1030 Last data filed at 05/29/2019 0900 Gross per 24 hour  Intake 2710.78 ml  Output 1550 ml  Net 1160.78 ml    General:  In bed on vent HENT: NCAT ETT in place PULM: Crackles/wheezes bilaterally B, vent supported breathing CV: RRR, no mgr GI: BS+, soft, nontender MSK: normal bulk and tone Neuro: sedated on vent   CBC    Component Value Date/Time   WBC 14.6 (H) 05/29/2019 0200   RBC 3.11 (L) 05/29/2019 0200   HGB 8.3 (L) 05/29/2019 0200   HCT 30.7 (L) 05/29/2019 0200   PLT 268 05/29/2019 0200   MCV 98.7 05/29/2019 0200   MCH 26.7 05/29/2019 0200   MCHC 27.0 (L) 05/29/2019 0200   RDW 19.8 (H) 05/29/2019 0200   LYMPHSABS 0.8 05/20/2019 0602   MONOABS 1.1 (H) 05/20/2019 0602   EOSABS 0.0 05/20/2019 0602   BASOSABS 0.1 05/20/2019 0602    BMET    Component Value Date/Time   NA 145 05/29/2019 0200   K 4.7 05/29/2019 0200   CL 101 05/29/2019 0200   CO2 37 (H) 05/29/2019 0200   GLUCOSE 241 (H) 05/29/2019 0200   BUN 29 (H) 05/29/2019 0200   CREATININE 0.49 05/29/2019 0200   CALCIUM 8.2 (L) 05/29/2019 0200   GFRNONAA >60 05/29/2019 0200   GFRAA >60 05/29/2019 0200    CXR images none today  Impression/Plan: ARDS: no meaningful improvement, prognosis remains poor, family deciding if they want to continue this level of care.  Decrease  peep today (likely over distended), diurese, ketamine to continue for comfort.  Continue  Need for sedation: dilaudid, versed to continue, increase clonazepam and oxycodone, continue ketamine  My cc time 35 minutes  Roselie Awkward, MD [facility] Pager: [phone] Cell: [phone] After 3pm or if no response, call [phone]

## 2019-05-30 LAB — CBC
HCT: 33.7 % — ABNORMAL LOW (ref 36.0–46.0)
Hemoglobin: 9.2 g/dL — ABNORMAL LOW (ref 12.0–15.0)
MCH: 27.4 pg (ref 26.0–34.0)
MCHC: 27.3 g/dL — ABNORMAL LOW (ref 30.0–36.0)
MCV: 100.3 fL — ABNORMAL HIGH (ref 80.0–100.0)
Platelets: 302 10*3/uL (ref 150–400)
RBC: 3.36 MIL/uL — ABNORMAL LOW (ref 3.87–5.11)
RDW: 19.4 % — ABNORMAL HIGH (ref 11.5–15.5)
WBC: 13.9 10*3/uL — ABNORMAL HIGH (ref 4.0–10.5)
nRBC: 34 % — ABNORMAL HIGH (ref 0.0–0.2)

## 2019-05-30 LAB — BASIC METABOLIC PANEL
Anion gap: 14 (ref 5–15)
BUN: 49 mg/dL — ABNORMAL HIGH (ref 8–23)
CO2: 32 mmol/L (ref 22–32)
Calcium: 8 mg/dL — ABNORMAL LOW (ref 8.9–10.3)
Chloride: 101 mmol/L (ref 98–111)
Creatinine, Ser: 1.3 mg/dL — ABNORMAL HIGH (ref 0.44–1.00)
GFR calc Af Amer: 50 mL/min — ABNORMAL LOW (ref >60)
GFR calc non Af Amer: 43 mL/min — ABNORMAL LOW (ref >60)
Glucose, Bld: 244 mg/dL — ABNORMAL HIGH (ref 70–99)
Potassium: 5.8 mmol/L — ABNORMAL HIGH (ref 3.5–5.1)
Sodium: 147 mmol/L — ABNORMAL HIGH (ref 135–145)

## 2019-05-30 LAB — BRAIN NATRIURETIC PEPTIDE: B Natriuretic Peptide: 310.6 pg/mL — ABNORMAL HIGH (ref 0.0–100.0)

## 2019-05-30 LAB — GLUCOSE, CAPILLARY
Glucose-Capillary: 236 mg/dL — ABNORMAL HIGH (ref 70–99)
Glucose-Capillary: 256 mg/dL — ABNORMAL HIGH (ref 70–99)
Glucose-Capillary: 261 mg/dL — ABNORMAL HIGH (ref 70–99)

## 2019-05-30 LAB — HEPARIN LEVEL (UNFRACTIONATED): Heparin Unfractionated: 0.43 IU/mL (ref 0.30–0.70)

## 2019-05-30 LAB — SEDIMENTATION RATE: Sed Rate: 98 mm/hr — ABNORMAL HIGH (ref 0–22)

## 2019-05-30 MED ORDER — MIDAZOLAM HCL 2 MG/2ML IJ SOLN
2.0000 mg | INTRAMUSCULAR | Status: DC | PRN
  Administered 2019-05-30: 4 mg via INTRAVENOUS
  Filled 2019-05-30: qty 4

## 2019-05-30 MED ORDER — PANTOPRAZOLE SODIUM 40 MG PO PACK
40.0000 mg | PACK | Freq: Two times a day (BID) | ORAL | Status: DC
  Administered 2019-05-30: 40 mg
  Filled 2019-05-30: qty 20

## 2019-05-30 MED ORDER — POLYVINYL ALCOHOL 1.4 % OP SOLN
1.0000 [drp] | Freq: Four times a day (QID) | OPHTHALMIC | Status: DC | PRN
  Filled 2019-05-30: qty 15

## 2019-05-30 MED ORDER — FENTANYL CITRATE (PF) 100 MCG/2ML IJ SOLN
25.0000 ug | INTRAMUSCULAR | Status: DC | PRN
  Administered 2019-05-30: 15:00:00 100 ug via INTRAVENOUS
  Filled 2019-05-30: qty 2

## 2019-05-30 MED ORDER — GLYCOPYRROLATE 0.2 MG/ML IJ SOLN: 0.2000 mg | INTRAMUSCULAR | Status: DC | PRN

## 2019-05-30 MED ORDER — ACETAMINOPHEN 325 MG PO TABS: 650.0000 mg | ORAL_TABLET | Freq: Four times a day (QID) | ORAL | Status: DC | PRN

## 2019-05-30 MED ORDER — ATORVASTATIN CALCIUM 10 MG PO TABS
10.0000 mg | ORAL_TABLET | Freq: Every day | ORAL | Status: DC
  Administered 2019-05-30: 10 mg
  Filled 2019-05-30: qty 1

## 2019-05-30 MED ORDER — GLYCOPYRROLATE 1 MG PO TABS
1.0000 mg | ORAL_TABLET | ORAL | Status: DC | PRN
  Filled 2019-05-30: qty 1

## 2019-05-30 MED ORDER — ATORVASTATIN CALCIUM 10 MG PO TABS: 10.0000 mg | ORAL_TABLET | Freq: Every day | ORAL | Status: DC

## 2019-05-30 MED ORDER — VASOPRESSIN 20 UNIT/ML IV SOLN
0.0300 [IU]/min | INTRAVENOUS | Status: DC
  Administered 2019-05-30: 05:00:00 0.03 [IU]/min via INTRAVENOUS
  Filled 2019-05-30: qty 2

## 2019-05-30 MED ORDER — IPRATROPIUM-ALBUTEROL 0.5-2.5 (3) MG/3ML IN SOLN: 3.0000 mL | Freq: Four times a day (QID) | RESPIRATORY_TRACT | Status: DC | PRN

## 2019-05-30 MED ORDER — PHENYLEPHRINE HCL-NACL 40-0.9 MG/250ML-% IV SOLN
0.0000 ug/min | INTRAVENOUS | Status: DC
  Administered 2019-05-30: 200 ug/min via INTRAVENOUS
  Administered 2019-05-30: 15:00:00 260 ug/min via INTRAVENOUS
  Administered 2019-05-30: 02:00:00 175 ug/min via INTRAVENOUS
  Filled 2019-05-30 (×5): qty 250

## 2019-05-30 MED ORDER — DEXTROSE 5 % IV SOLN: INTRAVENOUS | Status: DC

## 2019-05-30 MED ORDER — INSULIN DETEMIR 100 UNIT/ML ~~LOC~~ SOLN: 30.0000 [IU] | Freq: Every day | SUBCUTANEOUS | Status: DC

## 2019-05-30 MED ORDER — ACETAMINOPHEN 650 MG RE SUPP: 650.0000 mg | Freq: Four times a day (QID) | RECTAL | Status: DC | PRN

## 2019-05-30 MED ORDER — INSULIN ASPART 100 UNIT/ML ~~LOC~~ SOLN: 0.0000 [IU] | SUBCUTANEOUS | Status: DC

## 2019-05-30 MED ORDER — DIPHENHYDRAMINE HCL 50 MG/ML IJ SOLN: 25.0000 mg | INTRAMUSCULAR | Status: DC | PRN

## 2019-05-30 MED ORDER — INSULIN ASPART 100 UNIT/ML ~~LOC~~ SOLN: 10.0000 [IU] | SUBCUTANEOUS | Status: DC

## 2019-06-01 LAB — TYPE AND SCREEN
ABO/RH(D): O POS
Antibody Screen: NEGATIVE
Unit division: 0
Unit division: 0

## 2019-06-01 LAB — BPAM RBC
Blood Product Expiration Date: 202101012359
Blood Product Expiration Date: 202101052359
ISSUE DATE / TIME: 202012031309
ISSUE DATE / TIME: 202012042205
Unit Type and Rh: 5100
Unit Type and Rh: 5100

## 2019-06-26 NOTE — Progress Notes (Addendum)
NAME:  Teresa Barker, MRN:  [id number], DOB:  [birth year], LOS: 26 ADMISSION DATE:  05/04/2019, CONSULTATION DATE:  11/20 REFERRING MD:  Derrill Kay MD, CHIEF COMPLAINT:  COVID 19 pneumonia   Brief History   69 y/o female admitted 11/10 to [facility] for COVID pneumonia, intubated [facility] R on 11/19, moved to [facility] ICU on 11/20.   Past Medical History  COPD - not on oxygen DM2 Obesity Asthma GERD Hypertension Neuropathy Prior intubation - late 90's, on vent for 3 weeks for COPD  [place] [facility] Events   11/08 Admit to [facility] 11/19 Intubated, transfer to [facility] 11/21 New onset afib on admission > converted to NSR 11/22 Pneumomediastinum and pneumopericardium seen on CT chest 11/23 Proning protocol initiated  11/28 NMB stopped  11/30 ETT cuff blown, tube exchanged.   12/02 PEEP/FiO2 requirements remain unchanged 12/03 Ketamine added to sedation, transfused 1 unit PRBC for anemia  Consults:    Procedures:  ETT 11/19 >> 11/30 (exchange for blown cuff), 11/30 >> Femoral TLC 11/19 >> 11/21  R PICC 11/21 >>   Significant Diagnostic Tests:   CT Chest 11/22 >> moderate pneumopericardium, extensive pneumomediastinum, small bilateral pneumothoraces, bilateral peripheral GGO and consolidation involvement in >75% of lungs, small right pleural effusion  ECHO 11/23 >> EF 65 to 70%, LV hyperdynamic function.  Borderline LVH.  Indeterminate diastolic filling due to E-A fusion. Global RV systolic function normal.  RV systolic pressure 16.3 mmHg, moderately elevated. Trace MR. Mild TR. Mild calcification of AV. Moderately elevated pulmonary artery systolic pressure.  FOB 11/23 >> normal airways, no mucus plugging, no airway lesions  Micro Data:  SARS COV2 11/7 at [facility] >> Positive Tracheal aspirate 11/24 >> normal flora  MRSA PCR 11/20 >> negative  Antimicrobials/COVID19 Tx:  Completed remdesivir at [facility] Dexamethasone 11/20 >> 11/29 Pip/Tazo 11/25 >> 11/30  Interim  history/subjective:  Increasing pressor needs overnight.  Tarry colored loose stools.  Remains on 100% FiO2.  Objective   Blood pressure (!) 84/57, pulse (!) 111, temperature 99.4 F (37.4 C), temperature source Axillary, resp. rate (!) 35, height '4\' 11"'  (1.499 m), weight 85.5 kg, SpO2 (!) 78 %. CVP:  [14 mmHg-30 mmHg] 22 mmHg  Vent Mode: PRVC FiO2 (%):  [100 %] 100 % Set Rate:  [35 bmp] 35 bmp Vt Set:  [300 mL] 300 mL PEEP:  [16 cmH20] 16 cmH20 Plateau Pressure:  [35 cmH20-44 cmH20] 40 cmH20   Intake/Output Summary (Last 24 hours) at 06/25/2019 0916 Last data filed at Jun 25, 2019 0800 Gross per 24 hour  Intake 3447.36 ml  Output 565 ml  Net 2882.36 ml   Filed Weights   05/27/19 0453 05/28/19 0500 06-25-2019 0500  Weight: 81 kg 81 kg 85.5 kg    Examination:  General - sedated Eyes - pupils reactive ENT - ETT in place Cardiac - irregular, tachycardic Chest - decreased BS, b/l crackles Abdomen - soft, non tender, decreased bowel sounds Extremities - 1+ edema Skin - no rashes Neuro - RASS -3  CXR (reviewed by me) - b/l ASD   Resolved Hospital Problem list   Pneumomediastinum  Assessment & Plan:   Acute hypoxic respiratory failure with ARDS from COVID 19 pneumonia. - completed steroids 11/29, remdesivir at outside hospital - no response to prone positioning >> d/c'ed - remains on increased FiO2/PEEP needs - goal SpO2 88 to 95% - f/u CXR - check ESR, BNP  COPD. - prn BDs  Acute metabolic encephalopathy. - RASS goal - 3 to -4 -  continue ketamine, dilaudid, versed gtt - continue klonopin, neurontin, oxycodone  Hypotension from diuresis, sedation. - even fluid balance - pressors to keep MAP > 60  AKI from ATN. Hyperkalemia. - hold lasix - f/u BMET - not candidate for renal replacement given overall poor prognosis  DM type II. - SSI, linagliptin - increase levemir to 30 units daily  PAF. - hold heparin gtt for now in setting of possible GI  bleed  Anemia with concern for acute blood loss from Upper GI bleed. - BID protonix - f/u CBC - transfuse for Hb < 7 or significant bleeding  Moderate protein calorie malnutrition. - tube feeds  Best practice:  Diet: TF DVT prophylaxis: SCDs GI prophylaxis: protonix Mobility: BR Code Status: DNR if cardiac arrest Disposition: ICU  Labs:   CMP Latest Ref Rng & Units June 02, 2019 05/29/2019 05/28/2019  Glucose 70 - 99 mg/dL 244(H) 241(H) -  BUN 8 - 23 mg/dL 49(H) 29(H) -  Creatinine 0.44 - 1.00 mg/dL 1.30(H) 0.49 -  Sodium 135 - 145 mmol/L 147(H) 145 144  Potassium 3.5 - 5.1 mmol/L 5.8(H) 4.7 4.6  Chloride 98 - 111 mmol/L 101 101 -  CO2 22 - 32 mmol/L 32 37(H) -  Calcium 8.9 - 10.3 mg/dL 8.0(L) 8.2(L) -  Total Protein 6.5 - 8.1 g/dL - - -  Total Bilirubin 0.3 - 1.2 mg/dL - - -  Alkaline Phos 38 - 126 U/L - - -  AST 15 - 41 U/L - - -  ALT 0 - 44 U/L - - -    CBC Latest Ref Rng & Units 06/02/19 05/29/2019 05/29/2019  WBC 4.0 - 10.5 K/uL 13.9(H) - 14.6(H)  Hemoglobin 12.0 - 15.0 g/dL 9.2(L) 7.6(L) 8.3(L)  Hematocrit 36.0 - 46.0 % 33.7(L) 29.3(L) 30.7(L)  Platelets 150 - 400 K/uL 302 - 268    ABG    Component Value Date/Time   PHART 7.440 05/28/2019 0909   PCO2ART 58.2 (H) 05/28/2019 0909   PO2ART 44.0 (L) 05/28/2019 0909   HCO3 39.4 (H) 05/28/2019 0909   TCO2 41 (H) 05/28/2019 0909   ACIDBASEDEF 1.0 05/22/2019 1149   O2SAT 79.0 05/28/2019 0909    CBG (last 3)  Recent Labs    05/29/19 2332 2019-06-02 0353 June 02, 2019 0738  GLUCAP 238* 236* 256*    CC time 34 minutes  Chesley Mires, MD [place] [facility] 06-02-19, 9:30 AM

## 2019-06-26 NOTE — Progress Notes (Signed)
eLink Physician-Brief Progress Note Patient Name: Teresa Barker DOB: [birth year] MRN: [id number]   Date of Service  06/08/19  HPI/Events of Note  Notified of hypotension and maximum on neosynephrine. Patient sedated on ketamine 0.5, Dilaudid 2.23 and Versed 2 with no changes in rate for the past 48 hours.  eICU Interventions  Added vasopressin for BP support support but also discussed with bedside RN to start coming down on sedation as tolerated starting with versed then dilaudid. Would need to maintain on some Versed if still on Ketamine     Intervention Category Major Interventions: Hypotension - evaluation and management  Judd Lien 06-08-2019, 4:32 AM

## 2019-06-26 NOTE — Progress Notes (Signed)
ANTICOAGULATION CONSULT NOTE - Follow Up Consult  Pharmacy Consult for Heparin Indication: Afib   Allergies  Allergen Reactions  . Cefuroxime Rash  . Sulfamethoxazole-Trimethoprim Itching  . Aloe     itch  . Cephalosporins Rash    Patient Measurements: Height: 4\' 11"  (149.9 cm) Weight: 188 lb 7.9 oz (85.5 kg) IBW/kg (Calculated) : 43.2 Heparin Dosing Weight: 60kg  Vital Signs: Temp: 100.2 F (37.9 C) (12/05 0400) Temp Source: Axillary (12/05 0400) BP: 80/59 (12/05 0615) Pulse Rate: 109 (12/05 0615)  Labs: Recent Labs    05/28/19 0455  05/29/19 0200  05/29/19 1058 05/29/19 1700 05/29/19 2153 22-Jun-2019 0440  HGB 6.9*   < > 8.3*  --   --   --  7.6* 9.2*  HCT 25.6*   < > 30.7*  --   --   --  29.3* 33.7*  PLT 237  --  268  --   --   --   --  302  HEPARINUNFRC 0.24*   < >  --    < > 0.30 0.35  --  0.43  CREATININE 0.51  --  0.49  --   --   --   --  1.30*   < > = values in this interval not displayed.    Estimated Creatinine Clearance: 40.4 mL/min (A) (by C-G formula based on SCr of 1.3 mg/dL (H)).   Medications:  Infusions:  . sodium chloride 10 mL/hr at June 22, 2019 0244  . sodium chloride    . sodium chloride 10 mL/hr at 06-22-19 0600  . feeding supplement (VITAL AF 1.2 CAL) 1,000 mL (06/22/19 0330)  . heparin 900 Units/hr (22-Jun-2019 0600)  . HYDROmorphone 2.25 mg/hr (06-22-19 0600)  . ketamine (KETALAR) Adult IV Infusion 0.5 mg/kg/hr (June 22, 2019 0600)  . midazolam 1 mg/hr (2019-06-22 0600)  . phenylephrine (NEO-SYNEPHRINE) Adult infusion 200 mcg/min (06/22/19 0600)  . vasopressin (PITRESSIN) infusion - *FOR SHOCK* 0.03 Units/min (06-22-19 0600)    Assessment: 89 yoF admitted on 11/20 with COVID-19 pneumonia.  She had been on Lovenox for VTE prophylaxis.  Due to worsening hypoxemia and concern for PE, pharmacy was consulted to start IV heparin on 11/23.  She also has PAF.  Heparin level 0.43, increased, remains therapeutic on 900 units/hr.    Events:  12/3 Hgb  decreased to 6.9, but No major bleeding or complications reported.  RN reports very minor bleeding from oral sores.  Transfused 1 unit PRBC 12/4 Hgb improved to 8.3 s/p transfusion, no bleeding reported.  Overnight, 12/4 at 21:00, RN reports hypotensive and blood noticed in Flexiseal tubing.  Pressors started, transfused 1 unit PRBC. 12/5 Hgb increased to 9.2 s/p transfusion.  RN this morning reports a mild amount of blood in flexiseal, not profuse bleeding, appears to be lower GIB.  MD aware, no changes to heparin at this time.  Goal of Therapy:  Heparin level 0.3-0.7 units/ml Monitor platelets by anticoagulation protocol: Yes   Plan:  Continue heparin IV infusion 900 units/hr  Daily heparin level and CBC Continue to monitor H&H, s/s bleeding   Thanks for allowing pharmacy to be a part of this patient's care.  Gretta Arab PharmD, BCPS Clinical pharmacist phone 7am- 5pm: [phone] 22-Jun-2019 7:41 AM

## 2019-06-26 NOTE — Progress Notes (Signed)
Pt family called, discussed pt status and pt family requesting comfort measures only. Dr Halford Chessman made aware.

## 2019-06-26 NOTE — Progress Notes (Signed)
Called Teresa Barker and spoke with Kathlee Nations, RN and asked her to relay to MD that patient's blood pressure is now remaining consistently low and on maxed on neo drip at 200 mcg/min and needing more pressors. Kathlee Nations, RN to speak with Dr. Genevive Bi about blood pressure and vasopressors.

## 2019-06-26 NOTE — Death Summary Note (Signed)
Teresa Barker was a 69 y.o. female admitted to outside hospital on 05/03/19  with COVID 19 pneumonia.  She required intubation for ARDS on 05/14/19 and transferred to [facility].  She was treated with dexamethasone and remdesivir.  Also treated with prone positioning and paralytic agents.  Also treated with antibiotics for possible HCAP.  Course complicated by pneumomediastinum, and paroxysmal atrial fibrillation.  Developed refractory hypoxemia and shock requiring pressor agents.  Developed upper GI bleeding and required blood transfusion.  Clinical status continued to get worse.  Family decided for DNR status and transition to comfort measures.  She was extubated on 06-08-19 and expired at 15:32.  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

## 2019-06-26 NOTE — Progress Notes (Signed)
Updated pt's daughter, [name], and son in law, Octavia Bruckner, over the phone.  Explained Ms. Jurney is on maximal therapy and continues to show evidence of declining health status.  They understand this and would like to continue current therapies for now.  No further escalation of care such as adding dialysis if renal function continues to get worse.  If her medical status continues to get worse, then they might be ready to consider transition to comfort measures.  Time spent 14 minutes.  Chesley Mires, MD [facility] 06-04-2019, 9:52 AM

## 2019-06-26 NOTE — Procedures (Signed)
Extubation Procedure Note  Patient Details:   Name: Teresa Barker DOB: [birth year] MRN: [id number]   Airway Documentation:    Vent end date: 2019-06-15 Vent end time: 1530   Evaluation   Pt extubated to RA per Withdrawal of Life Protocol  Jesse Sans Jun 15, 2019, 3:39 PM

## 2019-06-26 NOTE — Progress Notes (Signed)
Vasopressors stopped per orders. Dual RN declaration of death noted time of death 42. MD aware & family notified.

## 2019-06-26 NOTE — Progress Notes (Signed)
Dr. Genevive Bi cameraed in room and discussed patient's low blood pressure and sedation.  MD asked RN to go down on versed and dilauded drips as tolerated and stated she would start vasopressin.

## 2019-06-26 NOTE — Progress Notes (Signed)
Assisted tele visit to patient with family members.  Darroll Bredeson P, RN  

## 2019-06-26 DEATH — deceased

## 2019-07-27 NOTE — Progress Notes (Signed)
Spoke with daughter Jeanell Sparrow in reference to left items in the safe. Stated that other sister Adrian Prince will come pick up today or tomorrow (07-20-19)

## 2021-11-17 IMAGING — DX DG CHEST 1V PORT
1 series · 1 of 1 positions shown · non-contrast
Comparison: Chest x-ray 05/15/2019

CLINICAL DATA: COVID positive, intubated

EXAM:
PORTABLE CHEST 1 VIEW

[chest ap]
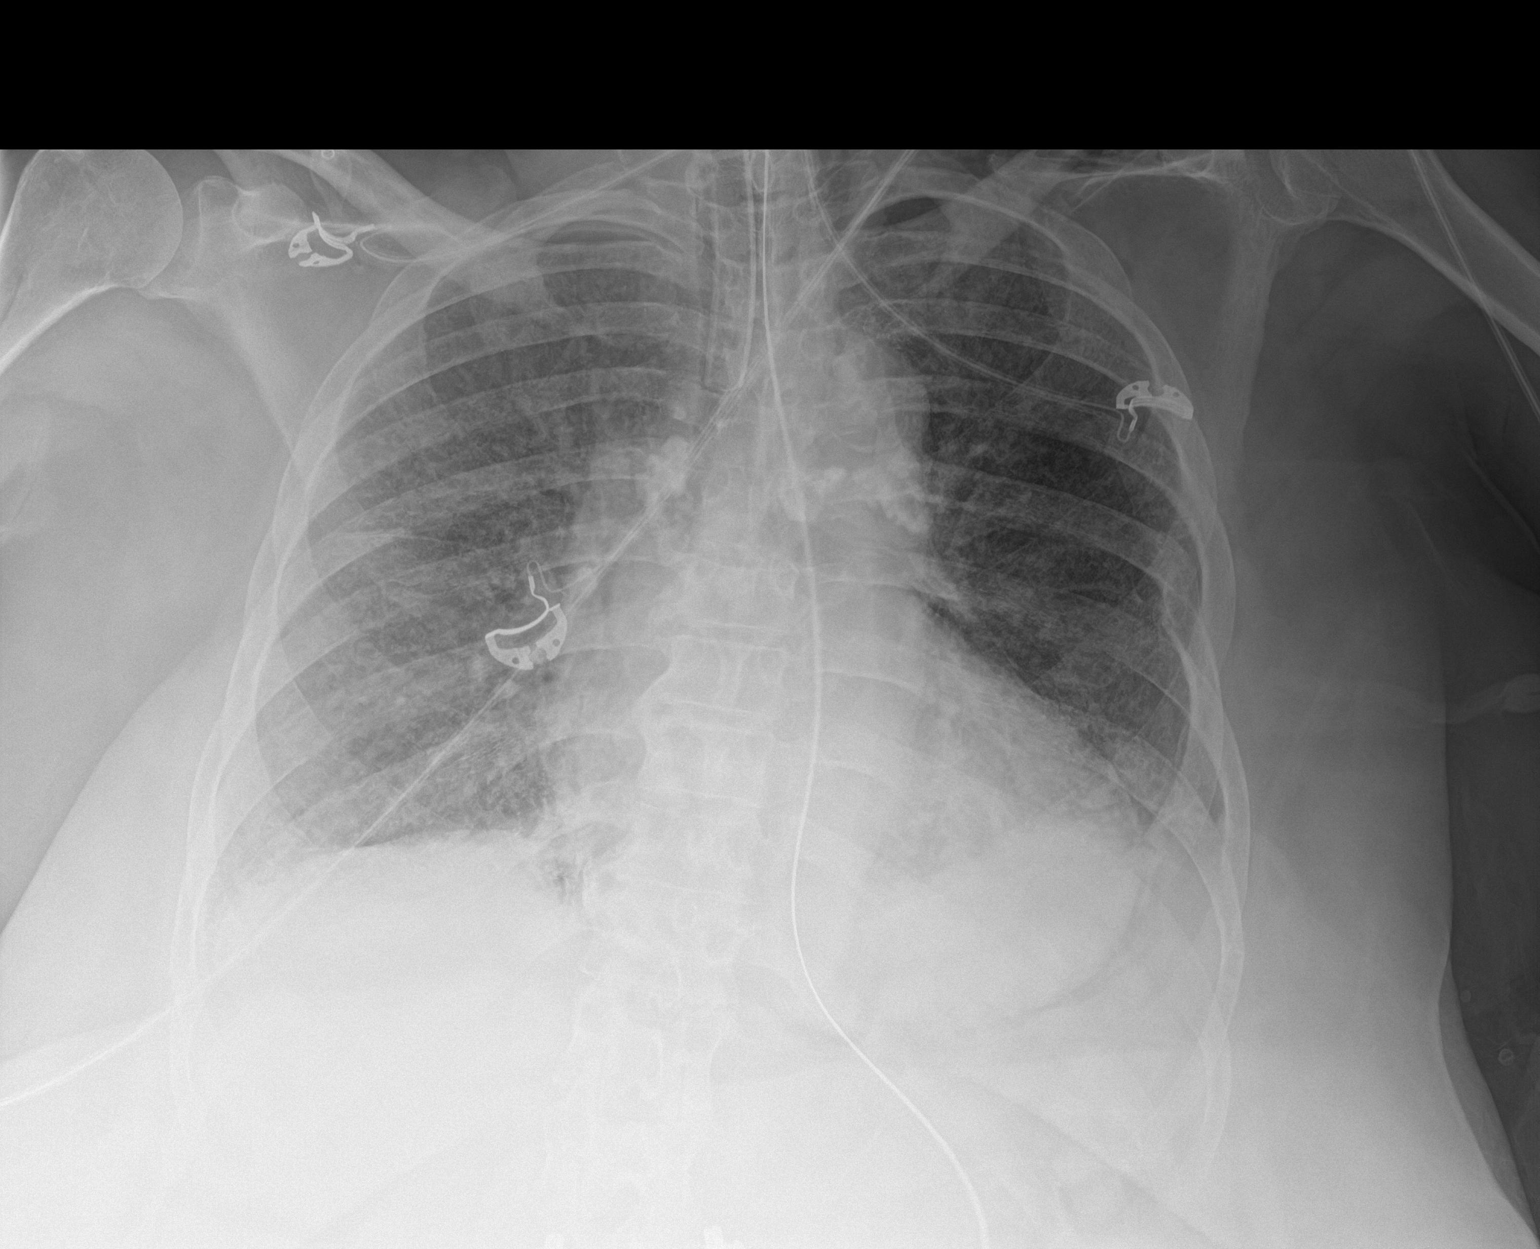

[1 of 1 positions shown; findings below may reference images not displayed]

FINDINGS: The tip of the endotracheal tube is 2.7 cm above the carina. Stable
cardiac and mediastinal contours. Calcified mediastinal lymph nodes
again noted. Overall, improved aeration and decreased interstitial
opacities compared to yesterday. New subcutaneous emphysema in the
soft tissues of the left neck. However, no pneumothorax is evident.
Remote healed left-sided rib fracture. Atherosclerotic
calcifications present in the transverse aorta. A gastric tube is
present. The tip lies off the field of view, presumably within the
stomach. Mild dependent atelectasis. No acute osseous abnormality.
IMPRESSION: 1. Overall improved aeration with decreasing bilateral interstitial
airspace opacities. This may reflect decreased vascular crowding in
the setting of improved lung volumes or a slight interval decrease
in interstitial edema.
2. New subcutaneous emphysema in the soft tissues of the left neck
and supraclavicular fossa of uncertain etiology.
3. Question trace pneumopericardium versus tiny medial pneumothorax
at the interface between the lingula and left heart.
4. Stable and satisfactory support apparatus.
5. Residual diffuse bilateral interstitial opacities which may
reflect residual interstitial edema or viral pneumonia.

## 2021-11-19 IMAGING — DX DG CHEST 1V PORT
1 series · 1 of 1 positions shown · non-contrast
Comparison: May 17, 2019

CLINICAL DATA: Acute respiratory failure

EXAM:
PORTABLE CHEST 1 VIEW

[chest ap]
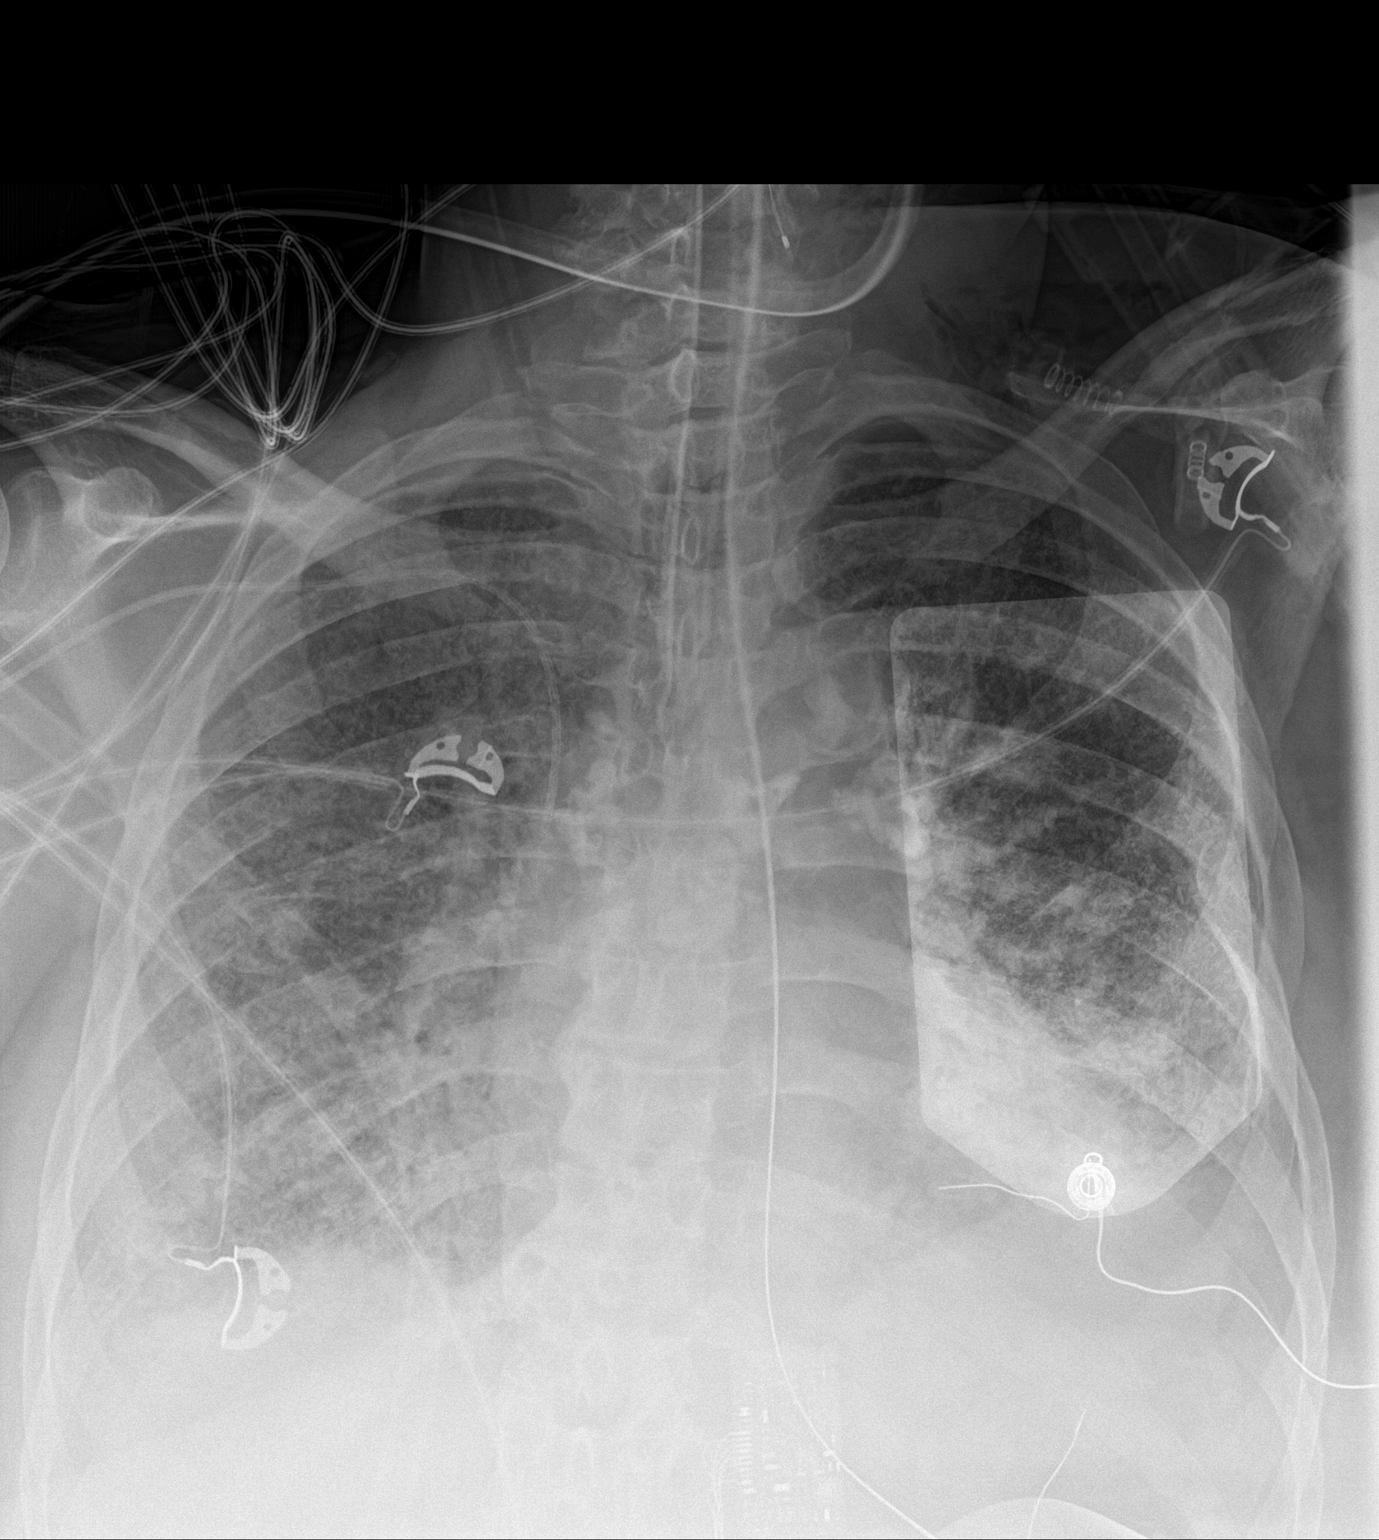

[1 of 1 positions shown; findings below may reference images not displayed]

FINDINGS: Endotracheal tube is unchanged in position. Right PICC line remains
present. Enteric tube passes below the diaphragm with tip out of
field of view. Persistent bilateral opacities in bilateral pleural
effusions. No pneumothorax. Stable partially obscured
cardiomediastinal contours.
IMPRESSION: Stable lines and tubes.

Persistent bilateral opacities and pleural effusions appear similar
to the prior study.

## 2021-11-21 IMAGING — DX DG CHEST 1V PORT
1 series · 1 of 1 positions shown · non-contrast
Comparison: Chest radiograph from one day prior.

CLINICAL DATA: Pneumomediastinum

EXAM:
PORTABLE CHEST 1 VIEW

[chest ap]
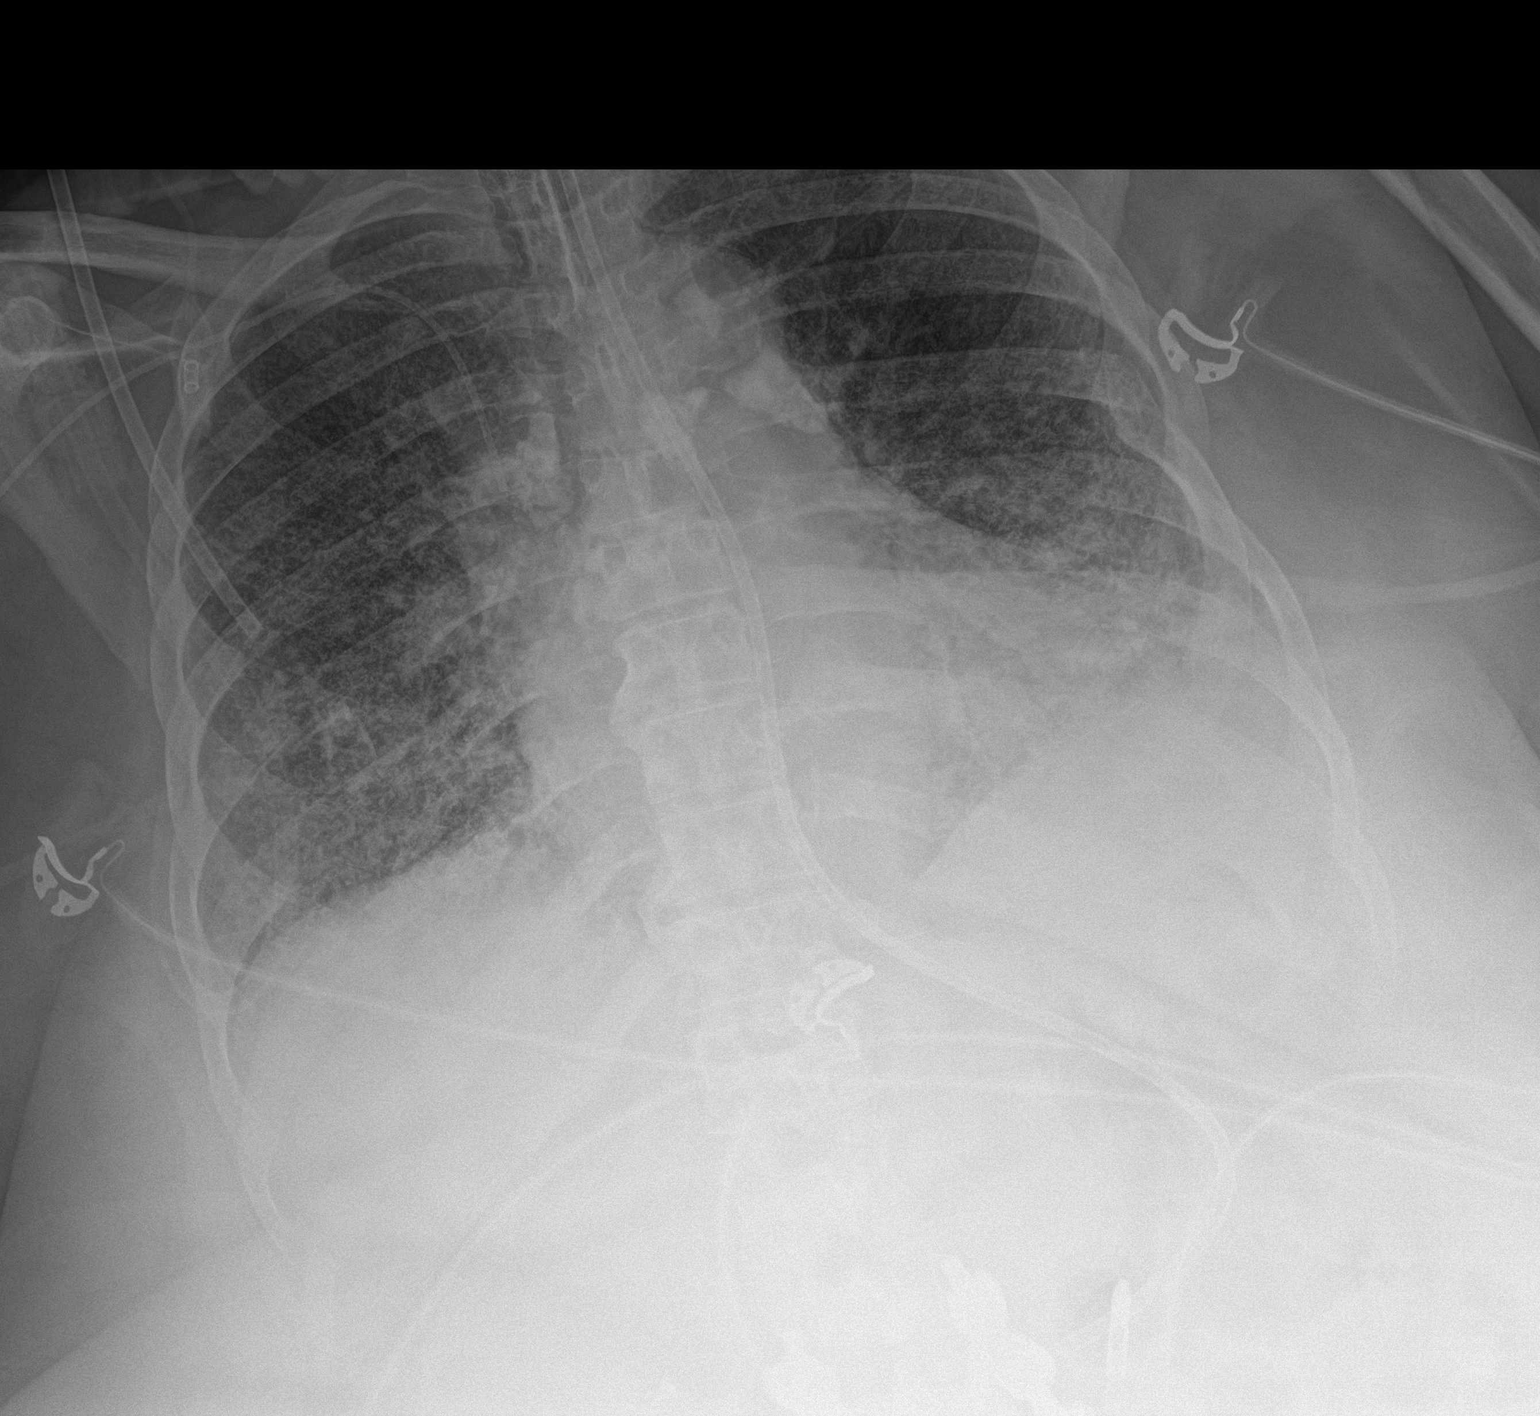

[1 of 1 positions shown; findings below may reference images not displayed]

FINDINGS: Endotracheal tube tip is 3.4 cm above the carina. Enteric tube
enters the distal stomach with the tip in the left abdomen probably
at the duodenal jejunal junction. Right PICC terminates in the
middle third of the SVC. Stable cardiomediastinal silhouette with
top-normal heart size. No evidence of pneumomediastinum. No
pneumothorax. No pleural effusion. Patchy opacities throughout both
lungs, most prominent at the lung bases, slightly improved.
IMPRESSION: 1. Support structures as detailed.
2. Diffuse patchy lung opacities, most prominent at the lung bases,
slightly improved, compatible with [id number] pneumonia.
